# Patient Record
Sex: Female | Born: 1955 | Race: White | Hispanic: No | Marital: Married | Smoking: Never smoker
Health system: Southern US, Community
[De-identification: ages and names within clinical notes are randomized; demographics above are authoritative.]

## PROBLEM LIST (undated history)

## (undated) DIAGNOSIS — R51 Headache: Secondary | ICD-10-CM

## (undated) DIAGNOSIS — Z789 Other specified health status: Secondary | ICD-10-CM

## (undated) DIAGNOSIS — M810 Age-related osteoporosis without current pathological fracture: Secondary | ICD-10-CM

## (undated) DIAGNOSIS — E785 Hyperlipidemia, unspecified: Secondary | ICD-10-CM

## (undated) DIAGNOSIS — K219 Gastro-esophageal reflux disease without esophagitis: Secondary | ICD-10-CM

## (undated) HISTORY — PX: NO PAST SURGERIES: SHX2092

## (undated) HISTORY — DX: Hyperlipidemia, unspecified: E78.5

## (undated) HISTORY — DX: Gastro-esophageal reflux disease without esophagitis: K21.9

## (undated) HISTORY — DX: Age-related osteoporosis without current pathological fracture: M81.0

## (undated) HISTORY — PX: HERNIA REPAIR: SHX51

---

## 2004-04-18 ENCOUNTER — Other Ambulatory Visit: Admission: RE | Admit: 2004-04-18 | Discharge: 2004-04-18 | Payer: Self-pay | Admitting: Family Medicine

## 2006-02-17 ENCOUNTER — Other Ambulatory Visit: Admission: RE | Admit: 2006-02-17 | Discharge: 2006-02-17 | Payer: Self-pay | Admitting: Family Medicine

## 2006-02-24 ENCOUNTER — Ambulatory Visit: Payer: Self-pay | Admitting: General Practice

## 2007-03-01 ENCOUNTER — Ambulatory Visit: Payer: Self-pay | Admitting: General Practice

## 2008-03-06 ENCOUNTER — Ambulatory Visit: Payer: Self-pay | Admitting: General Practice

## 2009-03-12 ENCOUNTER — Ambulatory Visit: Payer: Self-pay | Admitting: General Practice

## 2009-04-02 ENCOUNTER — Ambulatory Visit: Payer: Self-pay | Admitting: General Practice

## 2010-03-05 ENCOUNTER — Ambulatory Visit: Payer: Self-pay | Admitting: General Practice

## 2011-03-04 ENCOUNTER — Ambulatory Visit: Payer: Self-pay

## 2012-03-01 ENCOUNTER — Ambulatory Visit: Payer: Self-pay

## 2012-07-12 ENCOUNTER — Other Ambulatory Visit: Payer: Self-pay | Admitting: Obstetrics

## 2012-07-14 ENCOUNTER — Encounter (HOSPITAL_COMMUNITY): Payer: Self-pay

## 2012-07-18 ENCOUNTER — Other Ambulatory Visit: Payer: Self-pay | Admitting: Obstetrics

## 2012-07-19 ENCOUNTER — Encounter (HOSPITAL_COMMUNITY): Payer: Self-pay

## 2012-07-19 ENCOUNTER — Encounter (HOSPITAL_COMMUNITY)
Admission: RE | Admit: 2012-07-19 | Discharge: 2012-07-19 | Disposition: A | Discharge status: Home / Self Care | Payer: PRIVATE HEALTH INSURANCE | Source: Ambulatory Visit | Attending: Obstetrics | Admitting: Obstetrics

## 2012-07-19 HISTORY — DX: Other specified health status: Z78.9

## 2012-07-19 HISTORY — DX: Headache: R51

## 2012-07-19 LAB — SURGICAL PCR SCREEN: Staphylococcus aureus: NEGATIVE

## 2012-07-19 NOTE — Pre-Procedure Instructions (Signed)
Per Hughes Better in Dr. Elpidio Eric office, it is ok to use recent labs done in PMD's office for surgery.

## 2012-07-19 NOTE — Patient Instructions (Signed)
Your procedure is scheduled on:07/22/12  Enter through the Main Entrance at :6am Pick up desk phone and dial [phone] and inform us of your arrival.  Please call [phone] if you have any problems the morning of surgery.  Remember: Do not eat or drink after midnight:Thursday   Take these meds the morning of surgery with a sip of water:none  DO NOT wear jewelry, eye make-up, lipstick,body lotion, or dark fingernail polish. Do not shave for 48 hours prior to surgery.  If you are to be admitted after surgery, leave suitcase in car until your room has been assigned. Patients discharged on the day of surgery will not be allowed to drive home.

## 2012-07-22 ENCOUNTER — Encounter (HOSPITAL_COMMUNITY): Payer: Self-pay | Admitting: *Deleted

## 2012-07-22 ENCOUNTER — Encounter (HOSPITAL_COMMUNITY): Payer: Self-pay

## 2012-07-22 ENCOUNTER — Ambulatory Visit (HOSPITAL_COMMUNITY)
Admission: RE | Admit: 2012-07-22 | Discharge: 2012-07-22 | Disposition: A | Discharge status: Home / Self Care | Payer: PRIVATE HEALTH INSURANCE | Source: Ambulatory Visit | Attending: Obstetrics | Admitting: Obstetrics

## 2012-07-22 ENCOUNTER — Ambulatory Visit (HOSPITAL_COMMUNITY): Payer: PRIVATE HEALTH INSURANCE

## 2012-07-22 ENCOUNTER — Encounter (HOSPITAL_COMMUNITY)
Admission: RE | Disposition: A | Discharge status: Home / Self Care | Payer: Self-pay | Source: Ambulatory Visit | Attending: Obstetrics

## 2012-07-22 DIAGNOSIS — N840 Polyp of corpus uteri: Secondary | ICD-10-CM | POA: Insufficient documentation

## 2012-07-22 DIAGNOSIS — N950 Postmenopausal bleeding: Secondary | ICD-10-CM | POA: Insufficient documentation

## 2012-07-22 DIAGNOSIS — R9389 Abnormal findings on diagnostic imaging of other specified body structures: Secondary | ICD-10-CM | POA: Insufficient documentation

## 2012-07-22 DIAGNOSIS — D279 Benign neoplasm of unspecified ovary: Secondary | ICD-10-CM | POA: Insufficient documentation

## 2012-07-22 HISTORY — PX: LAPAROSCOPY: SHX197

## 2012-07-22 HISTORY — PX: SALPINGOOPHORECTOMY: SHX82

## 2012-07-22 HISTORY — PX: HYSTEROSCOPY WITH D & C: SHX1775

## 2012-07-22 SURGERY — LAPAROSCOPY OPERATIVE
Anesthesia: General | Wound class: Clean Contaminated

## 2012-07-22 MED ORDER — PROPOFOL 10 MG/ML IV EMUL
INTRAVENOUS | Status: AC
  Filled 2012-07-22: qty 20

## 2012-07-22 MED ORDER — FENTANYL CITRATE 0.05 MG/ML IJ SOLN
INTRAMUSCULAR | Status: AC
  Administered 2012-07-22: 25 ug via INTRAVENOUS
  Filled 2012-07-22: qty 2

## 2012-07-22 MED ORDER — ONDANSETRON HCL 4 MG/2ML IJ SOLN
INTRAMUSCULAR | Status: AC
  Filled 2012-07-22: qty 2

## 2012-07-22 MED ORDER — FENTANYL CITRATE 0.05 MG/ML IJ SOLN: 25.0000 ug | INTRAMUSCULAR | Status: DC | PRN

## 2012-07-22 MED ORDER — FENTANYL CITRATE 0.05 MG/ML IJ SOLN
INTRAMUSCULAR | Status: DC | PRN
  Administered 2012-07-22: 100 ug via INTRAVENOUS

## 2012-07-22 MED ORDER — MEPERIDINE HCL 25 MG/ML IJ SOLN: 6.2500 mg | INTRAMUSCULAR | Status: DC | PRN

## 2012-07-22 MED ORDER — LIDOCAINE HCL (CARDIAC) 20 MG/ML IV SOLN
INTRAVENOUS | Status: DC | PRN
  Administered 2012-07-22: 50 mg via INTRAVENOUS

## 2012-07-22 MED ORDER — BUPIVACAINE HCL (PF) 0.25 % IJ SOLN
INTRAMUSCULAR | Status: AC
  Filled 2012-07-22: qty 30

## 2012-07-22 MED ORDER — VASOPRESSIN 20 UNIT/ML IJ SOLN
INTRAMUSCULAR | Status: AC
  Filled 2012-07-22: qty 1

## 2012-07-22 MED ORDER — DEXAMETHASONE SODIUM PHOSPHATE 10 MG/ML IJ SOLN
INTRAMUSCULAR | Status: DC | PRN
  Administered 2012-07-22: 10 mg via INTRAVENOUS

## 2012-07-22 MED ORDER — KETOROLAC TROMETHAMINE 30 MG/ML IJ SOLN
INTRAMUSCULAR | Status: DC | PRN
  Administered 2012-07-22: 30 mg via INTRAVENOUS

## 2012-07-22 MED ORDER — FENTANYL CITRATE 0.05 MG/ML IJ SOLN
INTRAMUSCULAR | Status: AC
  Filled 2012-07-22: qty 5

## 2012-07-22 MED ORDER — SILVER NITRATE-POT NITRATE 75-25 % EX MISC
CUTANEOUS | Status: AC
  Filled 2012-07-22: qty 1

## 2012-07-22 MED ORDER — CHLOROPROCAINE HCL 1 % IJ SOLN
INTRAMUSCULAR | Status: DC | PRN
  Administered 2012-07-22: 30 mL

## 2012-07-22 MED ORDER — BUPIVACAINE HCL (PF) 0.25 % IJ SOLN
INTRAMUSCULAR | Status: DC | PRN
  Administered 2012-07-22: 30 mL

## 2012-07-22 MED ORDER — KETOROLAC TROMETHAMINE 30 MG/ML IJ SOLN
INTRAMUSCULAR | Status: AC
  Filled 2012-07-22: qty 1

## 2012-07-22 MED ORDER — PROMETHAZINE HCL 25 MG/ML IJ SOLN: 6.2500 mg | INTRAMUSCULAR | Status: DC | PRN

## 2012-07-22 MED ORDER — MIDAZOLAM HCL 5 MG/5ML IJ SOLN
INTRAMUSCULAR | Status: DC | PRN
  Administered 2012-07-22: 2 mg via INTRAVENOUS

## 2012-07-22 MED ORDER — ONDANSETRON HCL 4 MG/2ML IJ SOLN
INTRAMUSCULAR | Status: DC | PRN
  Administered 2012-07-22: 4 mg via INTRAVENOUS

## 2012-07-22 MED ORDER — PROPOFOL 10 MG/ML IV EMUL
INTRAVENOUS | Status: DC | PRN
  Administered 2012-07-22 (×2): 20 mg via INTRAVENOUS
  Administered 2012-07-22: 160 mg via INTRAVENOUS

## 2012-07-22 MED ORDER — GLYCOPYRROLATE 0.2 MG/ML IJ SOLN
INTRAMUSCULAR | Status: AC
  Filled 2012-07-22: qty 1

## 2012-07-22 MED ORDER — ROCURONIUM BROMIDE 50 MG/5ML IV SOLN
INTRAVENOUS | Status: AC
  Filled 2012-07-22: qty 1

## 2012-07-22 MED ORDER — OXYCODONE-ACETAMINOPHEN 5-325 MG PO TABS: 2.0000 | ORAL_TABLET | ORAL | Status: DC | PRN

## 2012-07-22 MED ORDER — ROCURONIUM BROMIDE 100 MG/10ML IV SOLN
INTRAVENOUS | Status: DC | PRN
  Administered 2012-07-22: 10 mg via INTRAVENOUS
  Administered 2012-07-22: 30 mg via INTRAVENOUS

## 2012-07-22 MED ORDER — MIDAZOLAM HCL 2 MG/2ML IJ SOLN
INTRAMUSCULAR | Status: AC
  Filled 2012-07-22: qty 2

## 2012-07-22 MED ORDER — NEOSTIGMINE METHYLSULFATE 1 MG/ML IJ SOLN
INTRAMUSCULAR | Status: DC | PRN
  Administered 2012-07-22: 1 mg via INTRAVENOUS

## 2012-07-22 MED ORDER — IBUPROFEN 200 MG PO TABS: 600.0000 mg | ORAL_TABLET | Freq: Four times a day (QID) | ORAL | Status: DC | PRN

## 2012-07-22 MED ORDER — LIDOCAINE HCL (CARDIAC) 20 MG/ML IV SOLN
INTRAVENOUS | Status: AC
  Filled 2012-07-22: qty 5

## 2012-07-22 MED ORDER — GLUCAGON HCL (RDNA) 1 MG IJ SOLR
INTRAMUSCULAR | Status: AC
  Filled 2012-07-22: qty 1

## 2012-07-22 MED ORDER — PHENYLEPHRINE 40 MCG/ML (10ML) SYRINGE FOR IV PUSH (FOR BLOOD PRESSURE SUPPORT)
PREFILLED_SYRINGE | INTRAVENOUS | Status: AC
  Filled 2012-07-22: qty 5

## 2012-07-22 MED ORDER — GLYCOPYRROLATE 0.2 MG/ML IJ SOLN
INTRAMUSCULAR | Status: DC | PRN
  Administered 2012-07-22 (×2): 0.2 mg via INTRAVENOUS

## 2012-07-22 MED ORDER — GLYCINE 1.5 % IR SOLN
Status: DC | PRN
  Administered 2012-07-22: 3000 mL

## 2012-07-22 MED ORDER — LACTATED RINGERS IV SOLN
INTRAVENOUS | Status: DC
  Administered 2012-07-22 (×3): via INTRAVENOUS

## 2012-07-22 MED ORDER — KETOROLAC TROMETHAMINE 30 MG/ML IJ SOLN: 15.0000 mg | Freq: Once | INTRAMUSCULAR | Status: DC | PRN

## 2012-07-22 MED ORDER — MIDAZOLAM HCL 2 MG/2ML IJ SOLN: 0.5000 mg | Freq: Once | INTRAMUSCULAR | Status: DC | PRN

## 2012-07-22 MED ORDER — LACTATED RINGERS IR SOLN
Status: DC | PRN
  Administered 2012-07-22: 3000 mL

## 2012-07-22 MED ORDER — CHLOROPROCAINE HCL 1 % IJ SOLN
INTRAMUSCULAR | Status: AC
  Filled 2012-07-22: qty 30

## 2012-07-22 MED ORDER — PHENYLEPHRINE HCL 10 MG/ML IJ SOLN
INTRAMUSCULAR | Status: DC | PRN
  Administered 2012-07-22: 80 ug via INTRAVENOUS

## 2012-07-22 MED ORDER — HEPARIN SODIUM (PORCINE) 5000 UNIT/ML IJ SOLN
INTRAMUSCULAR | Status: DC | PRN
  Administered 2012-07-22: 5000 [IU] via SUBCUTANEOUS

## 2012-07-22 MED ORDER — DEXAMETHASONE SODIUM PHOSPHATE 10 MG/ML IJ SOLN
INTRAMUSCULAR | Status: AC
  Filled 2012-07-22: qty 1

## 2012-07-22 SURGICAL SUPPLY — 55 items
ADH SKN CLS APL DERMABOND .7 (GAUZE/BANDAGES/DRESSINGS)
BAG SPEC RTRVL LRG 6X4 10 (ENDOMECHANICALS) ×2
BARRIER ADHS 3X4 INTERCEED (GAUZE/BANDAGES/DRESSINGS) IMPLANT
BLADE SURG 15 STRL LF C SS BP (BLADE) ×2 IMPLANT
BLADE SURG 15 STRL SS (BLADE) ×3
BRR ADH 4X3 ABS CNTRL BYND (GAUZE/BANDAGES/DRESSINGS)
CABLE HIGH FREQUENCY MONO STRZ (ELECTRODE) IMPLANT
CANISTER SUCTION 2500CC (MISCELLANEOUS) ×3 IMPLANT
CATH ROBINSON RED A/P 16FR (CATHETERS) ×3 IMPLANT
CHLORAPREP W/TINT 26ML (MISCELLANEOUS) ×3 IMPLANT
CLOTH BEACON ORANGE TIMEOUT ST (SAFETY) ×3 IMPLANT
CONTAINER PREFILL 10% NBF 60ML (FORM) ×6 IMPLANT
COVER MAYO STAND STRL (DRAPES) IMPLANT
DERMABOND ADVANCED (GAUZE/BANDAGES/DRESSINGS)
DERMABOND ADVANCED .7 DNX12 (GAUZE/BANDAGES/DRESSINGS) IMPLANT
DRESSING TELFA 8X3 (GAUZE/BANDAGES/DRESSINGS) ×3 IMPLANT
ELECT NDL TIP 2.8 STRL (NEEDLE) ×2 IMPLANT
ELECT NEEDLE TIP 2.8 STRL (NEEDLE) ×3 IMPLANT
ELECT REM PT RETURN 9FT ADLT (ELECTROSURGICAL)
ELECTRODE REM PT RTRN 9FT ADLT (ELECTROSURGICAL) IMPLANT
FORCEPS CUTTING 33CM 5MM (CUTTING FORCEPS) IMPLANT
GLOVE BIO SURGEON STRL SZ 6.5 (GLOVE) ×4 IMPLANT
GLOVE BIOGEL PI IND STRL 7.0 (GLOVE) ×2 IMPLANT
GLOVE BIOGEL PI INDICATOR 7.0 (GLOVE) ×1
GOWN PREVENTION PLUS LG XLONG (DISPOSABLE) ×9 IMPLANT
GOWN STRL REIN XL XLG (GOWN DISPOSABLE) ×6 IMPLANT
IV STOPCOCK 4 WAY 40  W/Y SET (IV SOLUTION)
IV STOPCOCK 4 WAY 40 W/Y SET (IV SOLUTION) IMPLANT
LOOP ANGLED CUTTING 22FR (CUTTING LOOP) IMPLANT
MANIPULATOR UTERINE 4.5 ZUMI (MISCELLANEOUS) ×1 IMPLANT
NEEDLE INSUFFLATION 120MM (ENDOMECHANICALS) IMPLANT
PACK HYSTEROSCOPY LF (CUSTOM PROCEDURE TRAY) ×3 IMPLANT
PACK LAPAROSCOPY BASIN (CUSTOM PROCEDURE TRAY) ×3 IMPLANT
PAD OB MATERNITY 4.3X12.25 (PERSONAL CARE ITEMS) ×3 IMPLANT
PENCIL BUTTON HOLSTER BLD 10FT (ELECTRODE) ×3 IMPLANT
POUCH SPECIMEN RETRIEVAL 10MM (ENDOMECHANICALS) ×1 IMPLANT
PROTECTOR NERVE ULNAR (MISCELLANEOUS) ×3 IMPLANT
SCISSORS LAP 5X35 DISP (ENDOMECHANICALS) IMPLANT
SEALER TISSUE G2 CVD JAW 35 (ENDOMECHANICALS) IMPLANT
SEALER TISSUE G2 CVD JAW 45CM (ENDOMECHANICALS)
SET IRRIG TUBING LAPAROSCOPIC (IRRIGATION / IRRIGATOR) IMPLANT
SOLUTION ELECTROLUBE (MISCELLANEOUS) IMPLANT
STENT BALLN UTERINE 3CM 6FR (Stent) IMPLANT
STENT BALLN UTERINE 4CM 6FR (STENTS) IMPLANT
SUT VICRYL 0 UR6 27IN ABS (SUTURE) IMPLANT
SUT VICRYL 4-0 PS2 18IN ABS (SUTURE) IMPLANT
SYR 50ML LL SCALE MARK (SYRINGE) IMPLANT
SYR 5ML LL (SYRINGE) ×3 IMPLANT
TOWEL OR 17X24 6PK STRL BLUE (TOWEL DISPOSABLE) ×6 IMPLANT
TRAY FOLEY CATH 14FR (SET/KITS/TRAYS/PACK) ×3 IMPLANT
TROCAR BALLN 12MMX100 BLUNT (TROCAR) ×1 IMPLANT
TROCAR XCEL NON-BLD 11X100MML (ENDOMECHANICALS) IMPLANT
TROCAR XCEL NON-BLD 5MMX100MML (ENDOMECHANICALS) ×6 IMPLANT
WARMER LAPAROSCOPE (MISCELLANEOUS) ×3 IMPLANT
WATER STERILE IRR 1000ML POUR (IV SOLUTION) ×3 IMPLANT

## 2012-07-22 NOTE — H&P (Signed)
See scanned doc for full H&P  Medically uncomplicated 57 yo pt w/ irreg postmenopausal bleeding. Work-up revealed nl pap, nl embx and probable endometrial polyp. Also 6.5 cm ovarian cyst w/o flow, most consistent with dermoid ovarian cyst.   Plan for hysteroscopy, D&C, polypectomy, laprascopic BSO. Pt aware R/B of procedure.  No change to meds, allergies, history  Plan as scheduled.  FOGLEMAN,KELLY A. 07/22/2012 7:40 AM

## 2012-07-22 NOTE — Transfer of Care (Signed)
Immediate Anesthesia Transfer of Care Note  Patient: Monica Wyatt  Procedure(s) Performed: Procedure(s): LAPAROSCOPY OPERATIVE (N/A) SALPINGO OOPHORECTOMY (Bilateral) DILATATION AND CURETTAGE /HYSTEROSCOPY (N/A)  Patient Location: PACU  Anesthesia Type:General  Level of Consciousness: awake  Airway & Oxygen Therapy: Patient Spontanous Breathing  Post-op Assessment: Report given to PACU RN  Post vital signs: stable  Filed Vitals:   07/22/12 0602  BP: 113/76  Pulse: 72  Temp: 36.3 C  Resp: 18    Complications: No apparent anesthesia complications

## 2012-07-22 NOTE — Brief Op Note (Signed)
07/22/2012  9:48 AM  PATIENT:  Monica Wyatt  57 y.o. female  PRE-OPERATIVE DIAGNOSIS:  Left Ovarian Dermoid Cyst;  Endometrial Thickening, endometrial polyp  POST-OPERATIVE DIAGNOSIS:  Left Ovarian Dermoid Cyst;  Endometrial Thickening, endometrial thickening  PROCEDURE:  Procedure(s): LAPAROSCOPY OPERATIVE (N/A) SALPINGO OOPHORECTOMY (Bilateral) DILATATION AND CURETTAGE /HYSTEROSCOPY (N/A) operative hysteroscopy polypectomy  SURGEON:  Surgeon(s) and Role:    * Kelly A. Ernestina Penna, MD - Primary    * Genia Del, MD - Assisting  PHYSICIAN ASSISTANT:   ASSISTANTSSeymour Bars, MD   ANESTHESIA:   general  EBL:  Total I/O In: 1700 [I.V.:1700] Out: 310 [Urine:300; Blood:10]  BLOOD ADMINISTERED:none  DRAINS: Urinary Catheter (Foley)   LOCAL MEDICATIONS USED:  MARCAINE,   SPECIMEN:  Source of Specimen:  Bilateral tubes and ovaries, endometrial curettings, endometrial polyp  DISPOSITION OF SPECIMEN:  PATHOLOGY  COUNTS:  YES  TOURNIQUET:  * No tourniquets in log *  DICTATION: .Note written in EPIC  PLAN OF CARE: Discharge to home after PACU  PATIENT DISPOSITION:  PACU - hemodynamically stable.   Delay start of Pharmacological VTE agent (>24hrs) due to surgical blood loss or risk of bleeding: yes

## 2012-07-22 NOTE — Op Note (Signed)
07/22/2012  9:48 AM  PATIENT:  Monica Wyatt  57 y.o. female  PRE-OPERATIVE DIAGNOSIS:  Left Ovarian Dermoid Cyst;  Endometrial Thickening, endometrial polyp  POST-OPERATIVE DIAGNOSIS:  Left Ovarian Dermoid Cyst;  Endometrial Thickening, endometrial thickening  PROCEDURE:  Procedure(s): LAPAROSCOPY OPERATIVE (N/A) SALPINGO OOPHORECTOMY (Bilateral) DILATATION AND CURETTAGE /HYSTEROSCOPY (N/A) operative hysteroscopy polypectomy  SURGEON:  Surgeon(s) and Role:    * Kelly A. Ernestina Penna, MD - Primary    * Genia Del, MD - Assisting  PHYSICIAN ASSISTANT:   ASSISTANTSSeymour Bars, MD   ANESTHESIA:   general  EBL:  Total I/O In: 1700 [I.V.:1700] Out: 310 [Urine:300; Blood:10]  BLOOD ADMINISTERED:none  DRAINS: Urinary Catheter (Foley)   LOCAL MEDICATIONS USED:  MARCAINE,   SPECIMEN:  Source of Specimen:  Bilateral tubes and ovaries, endometrial curettings, endometrial polyp  DISPOSITION OF SPECIMEN:  PATHOLOGY  COUNTS:  YES  TOURNIQUET:  * No tourniquets in log *  DICTATION: .Note written in EPIC  PLAN OF CARE: Discharge to home after PACU  PATIENT DISPOSITION:  PACU - hemodynamically stable.   Delay start of Pharmacological VTE agent (>24hrs) due to surgical blood loss or risk of bleeding: yes  Estimated blood loss: 20 cc Complications: None Antibiotics: None  Findings: 6 cm size left ovarian dermoid, normal right ovary, normal bilateral tubes, small atrophic uterus with 1 cm posterior wall endometrial polyp. Normal liver edge, normal appendix  Indications: This is a 57 year old postmenopausal woman who presented to the office with postmenopausal bleeding. Ultrasound revealed focal thickening and a followup sonohysterogram reveals endometrial polyp. Incidental finding of 6 cm ovarian dermoid. Patient was managed expectantly for 3 months with no further bleeding however began to feel increased discomfort in the left lower quadrant. Decision was made to proceed with  hysteroscopy D&C, polypectomy, laparoscopic bilateral salpingo-oophorectomy. Patient understood hormonal risks and surgical risks but preferred to remove the contralateral ovary for concern of maybe needing further surgery in the future.   Procedure:  Patient was prepped and draped in the normal sterile fashion in dorsal supine lithotomy position. A Foley catheter was sterilely inserted into the bladder A bimanual examination was done to assess the size and position of the uterus. A sterile speculum was placed in the vagina and a single-tooth tenaculum used to grasp the anterior lip of the cervix. An acorn uterine manipulator was placed in the cervix and was attached to the tenaculum . The speculum was then removed.  Gloves were changed attention was turned to the patient's abdomen. 10 cc of one quarter percent Marcaine were used in the infraumbilical fold and in the left and right lower quadrants. A 10 mm skin incision was made in the supra-umbilical fold and blunt dissection was done until the fascia was identified. Coker clamps were placed on the fascial edges and the fascia was sharply divided with the Mayo scissors. The intraperitoneal cavity was entered. A pursestring suture of 0 Vicryl was placed along the fascial edges and the Hassan trocar was inserted into the abdomen. The peritoneum was then insufflated with CO2 gas to 15 mm of mercury.  The pelvis was fully evaluated with findings as above. Right and left lower quadrant sites were marked, local anesthetic was infused, and 5 mm skin incisions were made in the right left lower cord. Non-bladed 5 mm trochars were then inserted under direct visualization.  Grasping instruments were placed through the lower ports to aid in visualization of the patient's abdomen and pelvis. Bilateral ureters were seen peristalsing in the pelvic sidewalls.  He began on the left side. The left infundibulopelvic ligament was serially cauterized and transected with the gyrus  device. Dissection was carried out along the mesosalpinx until the uterine ovarian ligament was serially cauterized and transected. The left tube and ovary were completely transected and placed in the pelvis. Good hemostasis was assured. The uterus was then retracted to the left side there right tube and ovary were evaluated. The right ureter was seen again. The right infundibular pelvic ligament was serially cauterized and transected. Dissection was carried along the mesosalpinx until the right uterine ovarian ligament was serially transected and cauterized. The right tube and ovary were then free. Hemostasis was noted of the cauterized areas. A 5 mm camera was placed through the left lower quadrant port a Endo Catch bag was placed in the umbilicus port and grasping instruments were placed through the right lower quadrant port. Both tubes and ovaries were placed into the Endo Catch bag which was then pulled up to the abdomen. The umbilical trocar was removed the bag was opened the cyst was drained and the Endo Catch bag with the bilateral tubes and ovaries was then able to be removed through the umbilical port. This port was then placed back in the abdomen pneumoperitoneum was recreated and visualization of all pedicles noted good hemostasis. Suction irrigation was carried out.  Attention was then turned to the pelvis. Tenaculum was replaced on the cervix after the acorn manipulator was removed and the speculum was inserted. Using the small hysteroscope evaluation of the uterine cavity was noted. Bilateral ostia were seen. A posterior wall 1 cm polyp was noted. The hysteroscope was removed and attempt was made with grasping forcep and sharp curettage to remove the polyp. This was unsuccessful. The hysteroscope was reinserted several times to ensure adequate placement of the polyp forceps and the curette. After about 10 minutes of unsuccessfully removing the polyp the hysteroscopic graspers and scissors were used  to serially cut and tied on the polyp. Eventually the polyp came free and was removed. A second curettage was then done to clear all uterine walls. Hysteroscope was used after the last curettage to confirm good hemostasis of the uterus.  All  was removed from the vagina. Gown and gloves were then changed and attention was then made to the abdomen. No. Peritoneum was recreated and last look oh laparoscopy was done all remaining fluid was removed from the pelvis all pedicles were found to be hemostatic. 20 cc of quarter percent Marcaine were then infused into the pelvis and toward all IV was given. All instruments removed under direct visualization   the pursestring suture at the umbilicus was tied and the umbilical incision was closed with 4-0 Vicryl in a subcuticular fashion. Single sutures were placed the left and right lower quadrants and then covered with Dermabond    Sponge lap and needle counts were correct x3. Patient was woken from general anesthesia having tolerated the procedure well.

## 2012-07-22 NOTE — Anesthesia Postprocedure Evaluation (Signed)
  Anesthesia Post-op Note  Patient: Monica Wyatt  Procedure(s) Performed: Procedure(s): LAPAROSCOPY OPERATIVE (N/A) SALPINGO OOPHORECTOMY (Bilateral) DILATATION AND CURETTAGE /HYSTEROSCOPY (N/A)  Patient is awake and responsive. Pain and nausea are reasonably well controlled. Vital signs are stable and clinically acceptable. Oxygen saturation is clinically acceptable. There are no apparent anesthetic complications at this time. Patient is ready for discharge.

## 2012-07-22 NOTE — Anesthesia Preprocedure Evaluation (Addendum)
Anesthesia Evaluation  Patient identified by MRN, date of birth, ID band Patient awake    Reviewed: Allergy & Precautions, H&P , Patient's Chart, lab work & pertinent test results, reviewed documented beta blocker date and time   History of Anesthesia Complications Negative for: history of anesthetic complications  Airway Mallampati: II TM Distance: >3 FB Neck ROM: full    Dental no notable dental hx.    Pulmonary neg pulmonary ROS,  breath sounds clear to auscultation  Pulmonary exam normal       Cardiovascular Exercise Tolerance: Good negative cardio ROS  Rhythm:regular Rate:Normal     Neuro/Psych  Headaches, negative neurological ROS  negative psych ROS   GI/Hepatic negative GI ROS, Neg liver ROS,   Endo/Other  negative endocrine ROS  Renal/GU negative Renal ROS     Musculoskeletal   Abdominal   Peds  Hematology negative hematology ROS (+)   Anesthesia Other Findings   Reproductive/Obstetrics negative OB ROS                           Anesthesia Physical Anesthesia Plan  ASA: II  Anesthesia Plan: General ETT   Post-op Pain Management:    Induction:   Airway Management Planned:   Additional Equipment:   Intra-op Plan:   Post-operative Plan:   Informed Consent: I have reviewed the patients History and Physical, chart, labs and discussed the procedure including the risks, benefits and alternatives for the proposed anesthesia with the patient or authorized representative who has indicated his/her understanding and acceptance.   Dental Advisory Given  Plan Discussed with: CRNA, Surgeon and Anesthesiologist  Anesthesia Plan Comments:        Anesthesia Quick Evaluation

## 2012-07-25 ENCOUNTER — Encounter (HOSPITAL_COMMUNITY): Payer: Self-pay | Admitting: Obstetrics

## 2013-03-08 ENCOUNTER — Ambulatory Visit: Payer: Self-pay | Admitting: General Practice

## 2013-03-23 ENCOUNTER — Ambulatory Visit: Payer: Self-pay | Admitting: General Practice

## 2014-03-06 ENCOUNTER — Ambulatory Visit: Payer: Self-pay | Admitting: General Practice

## 2014-10-24 IMAGING — MG MM DIGITAL SCREENING BILAT W/ CAD
1 series · 4 of 4 positions shown · non-contrast
Comparison: Previous Exam(s)

CLINICAL DATA: Screening.

EXAM:
DIGITAL SCREENING BILATERAL MAMMOGRAM WITH CAD

[R CC · right · 4 of 4 slices shown]
[im 1/4]
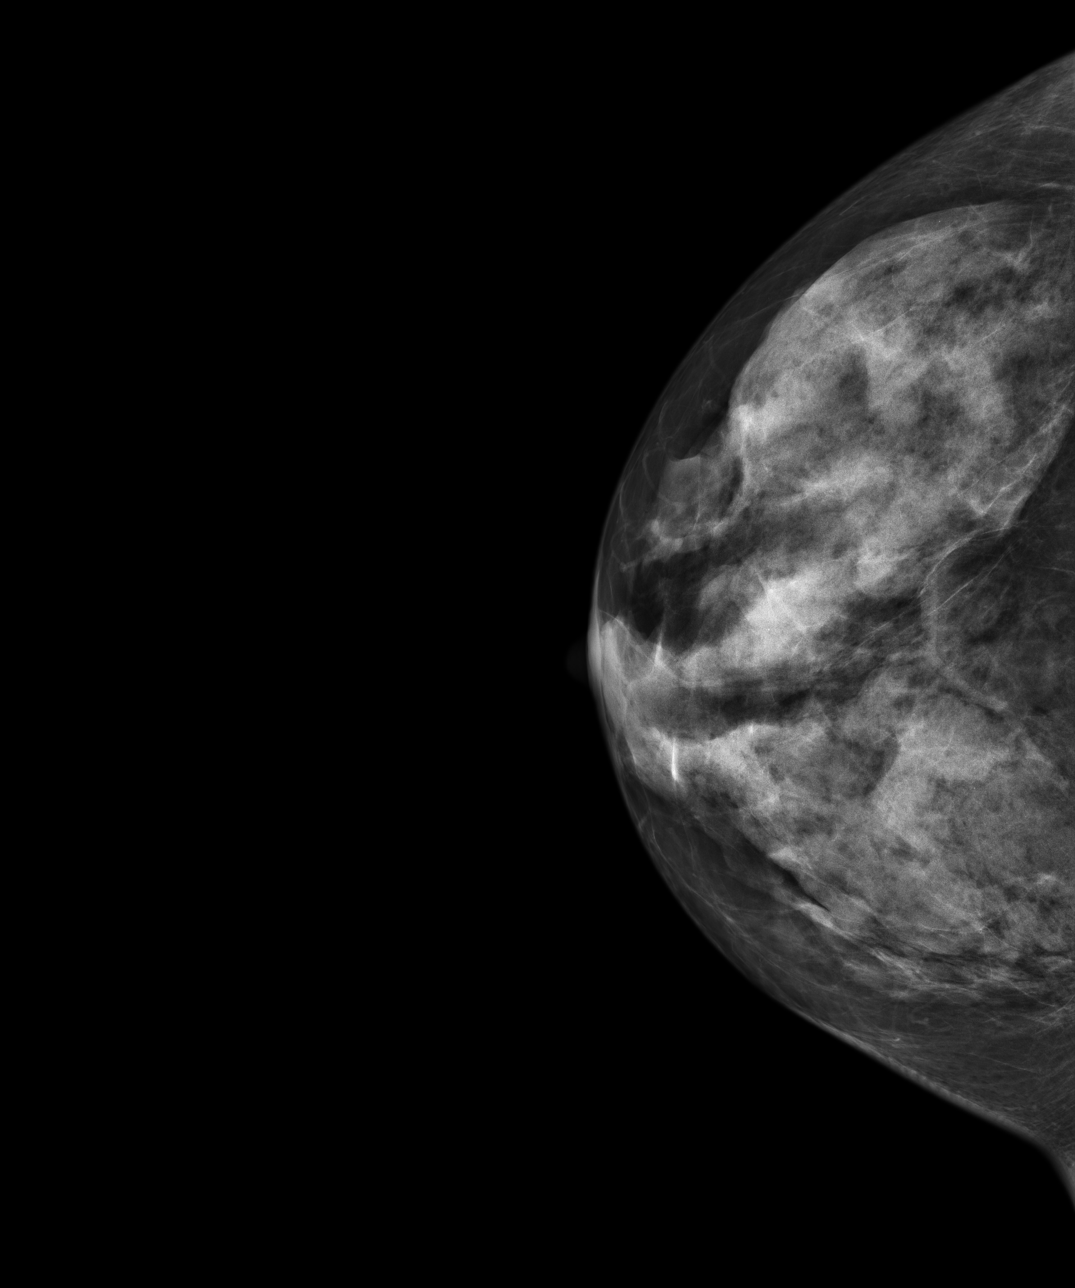
[im 2/4]
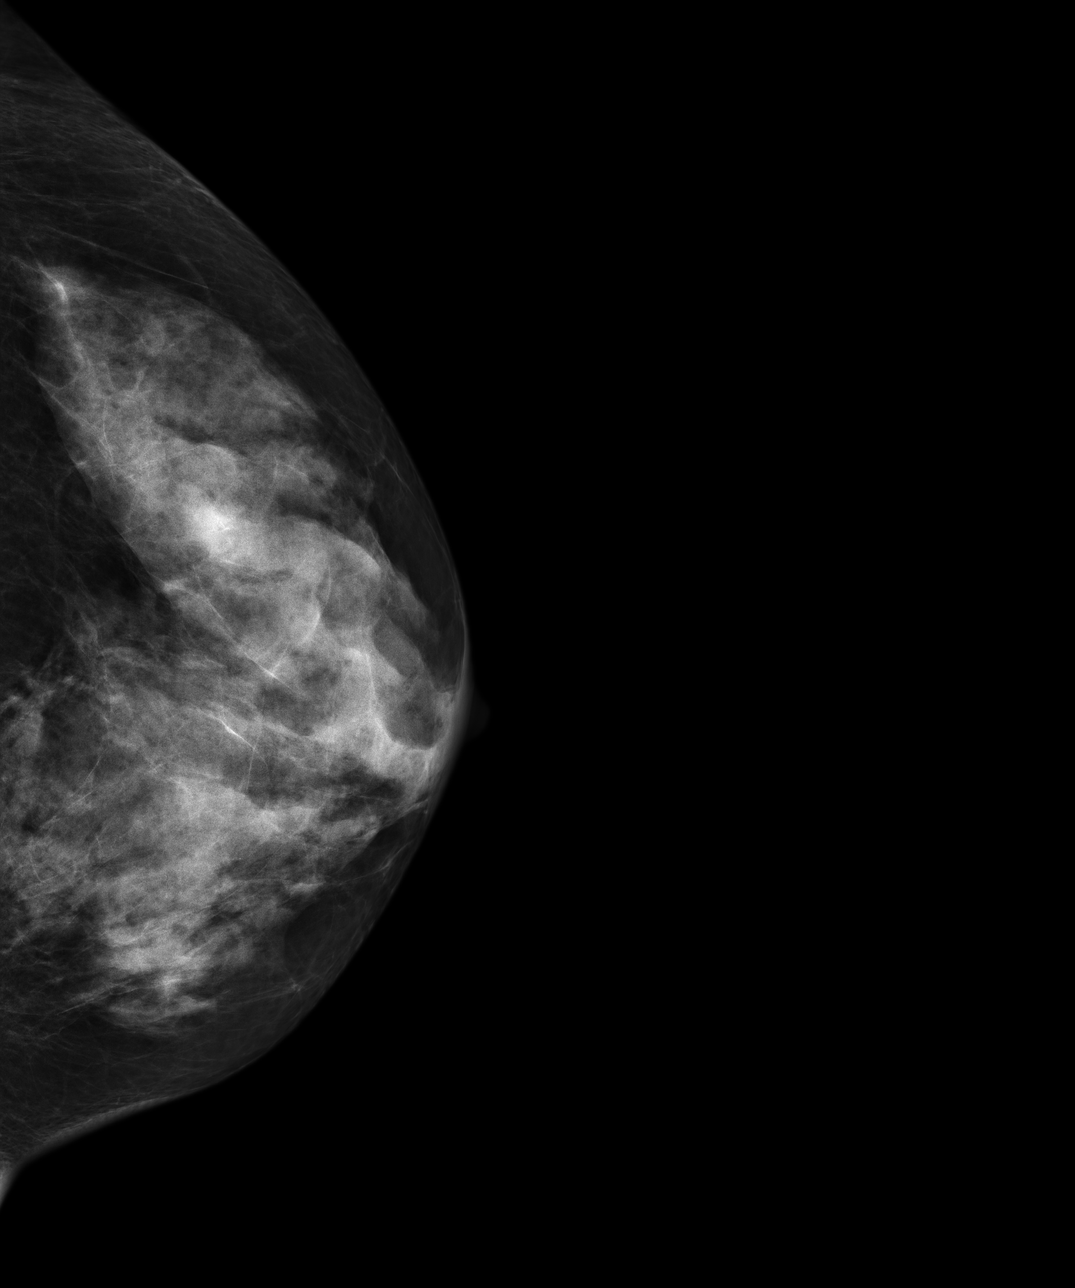
[im 3/4]
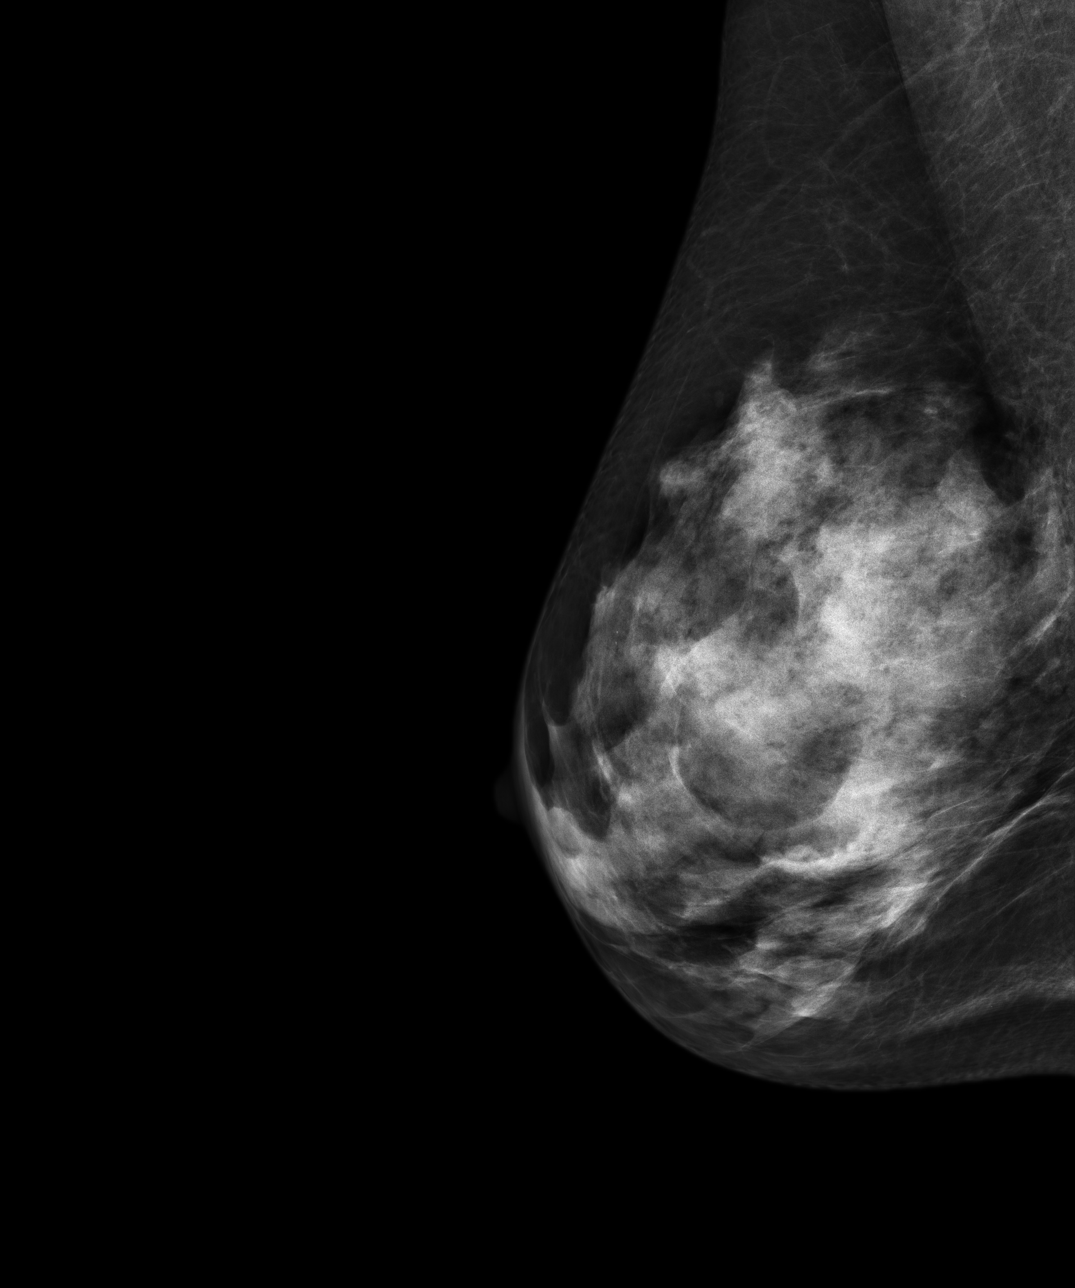
[im 4/4]
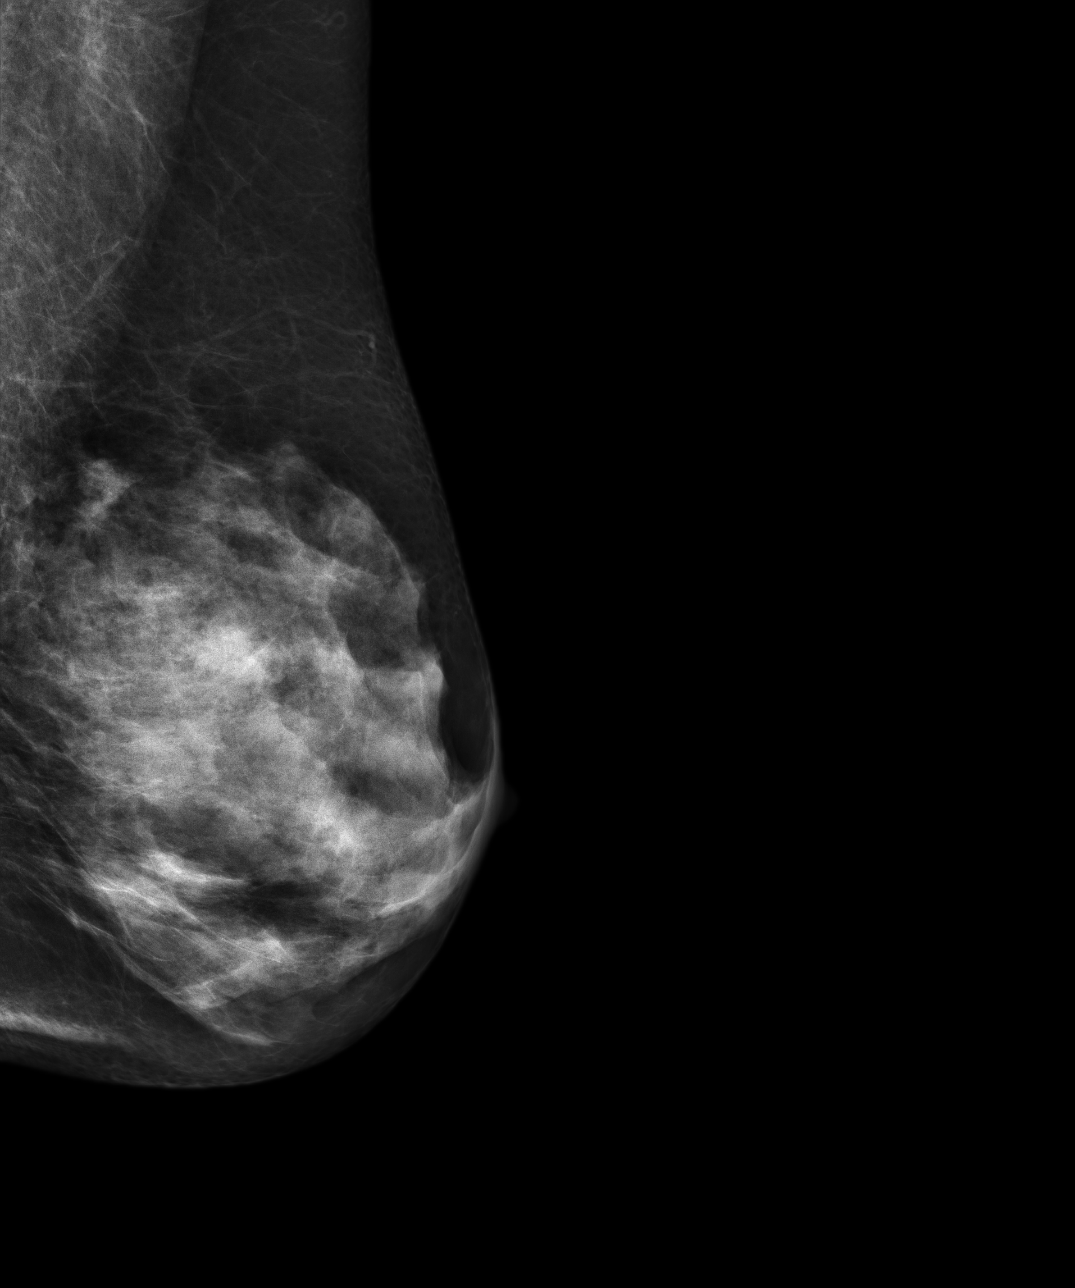

[4 of 4 positions shown; findings below may reference images not displayed]

ACR Breast Density Category d: The breasts are extremely dense,
which lowers the sensitivity of mammography.
FINDINGS: In the left breast possible mass warrants further imaging evaluation
with spot compression views and possibly ultrasound. In the right
breast, possible mass warrants further imaging evaluation with spot
compression views and possibly ultrasound. Images were processed
with CAD.
IMPRESSION: Further imaging evaluation is suggested for possible Cun the left
breast.

Further imaging evaluation is suggested for possible Cun the
right breast.

RECOMMENDATION:
Diagnostic mammogram and possibly ultrasound of both breasts.
(Code:R2-A-22X)

The patient will be contacted regarding the findings, and additional
imaging will be scheduled.

BI-RADS CATEGORY  0: Incomplete. Need additional imaging evaluation
and/or prior mammograms for comparison.

## 2015-02-20 ENCOUNTER — Other Ambulatory Visit: Payer: Self-pay | Admitting: Obstetrics

## 2015-02-20 DIAGNOSIS — Z1231 Encounter for screening mammogram for malignant neoplasm of breast: Secondary | ICD-10-CM

## 2015-02-26 ENCOUNTER — Ambulatory Visit
Admission: RE | Admit: 2015-02-26 | Discharge: 2015-02-26 | Disposition: A | Discharge status: Home / Self Care | Payer: PRIVATE HEALTH INSURANCE | Source: Ambulatory Visit | Attending: Obstetrics | Admitting: Obstetrics

## 2015-02-26 DIAGNOSIS — Z1231 Encounter for screening mammogram for malignant neoplasm of breast: Secondary | ICD-10-CM | POA: Diagnosis not present

## 2015-10-16 ENCOUNTER — Other Ambulatory Visit: Payer: Self-pay | Admitting: Obstetrics

## 2015-10-16 DIAGNOSIS — Z780 Asymptomatic menopausal state: Secondary | ICD-10-CM

## 2015-11-20 ENCOUNTER — Ambulatory Visit
Admission: RE | Admit: 2015-11-20 | Discharge: 2015-11-20 | Disposition: A | Discharge status: Home / Self Care | Payer: PRIVATE HEALTH INSURANCE | Source: Ambulatory Visit | Attending: Obstetrics | Admitting: Obstetrics

## 2015-11-20 DIAGNOSIS — Z780 Asymptomatic menopausal state: Secondary | ICD-10-CM | POA: Insufficient documentation

## 2015-11-20 DIAGNOSIS — M810 Age-related osteoporosis without current pathological fracture: Secondary | ICD-10-CM | POA: Insufficient documentation

## 2016-02-17 ENCOUNTER — Other Ambulatory Visit: Payer: Self-pay | Admitting: Family Medicine

## 2016-02-18 LAB — CMP12+LP+TP+TSH+6AC+CBC/D/PLT
A/G RATIO: 1.8 (ref 1.2–2.2)
ALBUMIN: 4.6 g/dL (ref 3.5–5.5)
ALT: 16 IU/L (ref 0–32)
AST: 21 IU/L (ref 0–40)
Alkaline Phosphatase: 70 IU/L (ref 39–117)
BUN/Creatinine Ratio: 16 (ref 9–23)
BUN: 15 mg/dL (ref 6–24)
Basophils Absolute: 0 10*3/uL (ref 0.0–0.2)
Basos: 1 %
Bilirubin Total: 0.5 mg/dL (ref 0.0–1.2)
CALCIUM: 9.4 mg/dL (ref 8.7–10.2)
CHOLESTEROL TOTAL: 149 mg/dL (ref 100–199)
Chloride: 100 mmol/L (ref 96–106)
Chol/HDL Ratio: 2.8 ratio units (ref 0.0–4.4)
Creatinine, Ser: 0.93 mg/dL (ref 0.57–1.00)
EOS (ABSOLUTE): 0.1 10*3/uL (ref 0.0–0.4)
Eos: 2 %
Estimated CHD Risk: <0.5 times avg. (ref 0.0–1.0)
FREE THYROXINE INDEX: 1.9 (ref 1.2–4.9)
GFR calc Af Amer: 78 mL/min/{1.73_m2} (ref >59)
GFR calc non Af Amer: 67 mL/min/{1.73_m2} (ref >59)
GGT: 11 IU/L (ref 0–60)
GLOBULIN, TOTAL: 2.6 g/dL (ref 1.5–4.5)
Glucose: 79 mg/dL (ref 65–99)
HDL: 53 mg/dL (ref >39)
Hematocrit: 40.3 % (ref 34.0–46.6)
Hemoglobin: 13.2 g/dL (ref 11.1–15.9)
IMMATURE GRANS (ABS): 0 10*3/uL (ref 0.0–0.1)
Immature Granulocytes: 0 %
Iron: 117 ug/dL (ref 27–159)
LDH: 222 IU/L (ref 119–226)
LDL Calculated: 82 mg/dL (ref 0–99)
LYMPHS: 38 %
Lymphocytes Absolute: 1.5 10*3/uL (ref 0.7–3.1)
MCH: 31.1 pg (ref 26.6–33.0)
MCHC: 32.8 g/dL (ref 31.5–35.7)
MCV: 95 fL (ref 79–97)
MONOS ABS: 0.4 10*3/uL (ref 0.1–0.9)
Monocytes: 11 %
NEUTROS ABS: 1.9 10*3/uL (ref 1.4–7.0)
NEUTROS PCT: 48 %
PHOSPHORUS: 3.5 mg/dL (ref 2.5–4.5)
PLATELETS: 223 10*3/uL (ref 150–379)
POTASSIUM: 5 mmol/L (ref 3.5–5.2)
RBC: 4.25 x10E6/uL (ref 3.77–5.28)
RDW: 13.5 % (ref 12.3–15.4)
Sodium: 142 mmol/L (ref 134–144)
T3 UPTAKE RATIO: 28 % (ref 24–39)
T4 TOTAL: 6.8 ug/dL (ref 4.5–12.0)
TRIGLYCERIDES: 71 mg/dL (ref 0–149)
TSH: 3.18 u[IU]/mL (ref 0.450–4.500)
Total Protein: 7.2 g/dL (ref 6.0–8.5)
Uric Acid: 4.8 mg/dL (ref 2.5–7.1)
VLDL Cholesterol Cal: 14 mg/dL (ref 5–40)
WBC: 4 10*3/uL (ref 3.4–10.8)

## 2016-02-18 LAB — VITAMIN D 25 HYDROXY (VIT D DEFICIENCY, FRACTURES): Vit D, 25-Hydroxy: 42.8 ng/mL (ref 30.0–100.0)

## 2016-02-18 LAB — HGB A1C W/O EAG: Hgb A1c MFr Bld: 5.7 % — ABNORMAL HIGH (ref 4.8–5.6)

## 2016-02-27 LAB — BASIC METABOLIC PANEL: Glucose: 79 mg/dL

## 2016-02-27 LAB — HEMOGLOBIN A1C: HEMOGLOBIN A1C: 5.7

## 2016-02-27 LAB — LIPID PANEL
Cholesterol: 149 mg/dL (ref 0–200)
HDL: 53 mg/dL (ref 35–70)
LDL CALC: 82 mg/dL
TRIGLYCERIDES: 71 mg/dL (ref 40–160)

## 2016-04-20 ENCOUNTER — Ambulatory Visit: Payer: PRIVATE HEALTH INSURANCE | Admitting: Family Medicine

## 2016-06-08 ENCOUNTER — Ambulatory Visit (INDEPENDENT_AMBULATORY_CARE_PROVIDER_SITE_OTHER): Payer: PRIVATE HEALTH INSURANCE | Admitting: Adult Health

## 2016-06-08 ENCOUNTER — Encounter: Payer: Self-pay | Admitting: Adult Health

## 2016-06-08 VITALS — BP 108/76 | HR 80 | Ht 64.0 in | Wt 127.4 lb

## 2016-06-08 DIAGNOSIS — G44229 Chronic tension-type headache, not intractable: Secondary | ICD-10-CM

## 2016-06-08 DIAGNOSIS — K4090 Unilateral inguinal hernia, without obstruction or gangrene, not specified as recurrent: Secondary | ICD-10-CM

## 2016-06-08 DIAGNOSIS — E7800 Pure hypercholesterolemia, unspecified: Secondary | ICD-10-CM | POA: Diagnosis not present

## 2016-06-08 DIAGNOSIS — E785 Hyperlipidemia, unspecified: Secondary | ICD-10-CM | POA: Insufficient documentation

## 2016-06-08 MED ORDER — ATORVASTATIN CALCIUM 20 MG PO TABS: 20.0000 mg | ORAL_TABLET | Freq: Every day | ORAL | 1 refills | Status: DC

## 2016-06-08 NOTE — Progress Notes (Signed)
Subjective:    Patient ID: Monica Wyatt, female    DOB: 1956-05-04, 61 y.o.   MRN: [id number]  HPI:  Ms.Tarkington presents to establish as a new pt.  She feel well generally, with only acute issues of "tension HA", and left lower abdominal pain with "a bulge".  She has had HAs  On/off for years and feels that they are triggered by work stress.  She denies dizziness/vision changes or N/V during HA.  She also reports Oct 2017 she may have "lifted something heavy at work" and she developed LLQ "puffiness, like a bulge".  She reports that the bulge is intermittent, can be "pushed back in", and only caused minor aching at times.  She denies previous hernia's.  She denies diffuse abdominal pain or blood in stool.     Patient Care Team    Relationship Specialty Notifications Start End  Odella Aquas, NP PCP - General Family Medicine  06/08/16   [name], MD Consulting Physician Dermatology  06/08/16   Aloha Gell, MD Consulting Physician Obstetrics and Gynecology  06/08/16     Patient Active Problem List   Diagnosis Date Noted  . Elevated cholesterol 06/08/2016  . Left inguinal hernia 06/08/2016  . Chronic tension-type headache, not intractable 06/08/2016     Past Medical History:  Diagnosis Date  . Headache(784.0)   . Hyperlipidemia   . Medical history non-contributory   . Osteoporosis      Past Surgical History:  Procedure Laterality Date  . HYSTEROSCOPY W/D&C N/A 07/22/2012   Procedure: DILATATION AND CURETTAGE /HYSTEROSCOPY;  Surgeon: [name]. [name], MD;  Location: [facility];  Service: Gynecology;  Laterality: N/A;  . LAPAROSCOPY N/A 07/22/2012   Procedure: LAPAROSCOPY OPERATIVE;  Surgeon: [name] [name], MD;  Location: [facility];  Service: Gynecology;  Laterality: N/A;  . NO PAST SURGERIES    . SALPINGOOPHORECTOMY Bilateral 07/22/2012   Procedure: SALPINGO OOPHORECTOMY;  Surgeon: [name]. [name], MD;  Location: [facility];  Service: Gynecology;  Laterality: Bilateral;      Family History  Problem Relation Age of Onset  . Dementia Mother   . COPD Father   . Healthy Sister   . Alcohol abuse Brother   . Sleep apnea Son   . Healthy Sister      History  Drug Use No     History  Alcohol Use No     History  Smoking Status  . Never Smoker  Smokeless Tobacco  . Never Used     Outpatient Encounter Prescriptions as of 06/08/2016  Medication Sig  . alendronate (FOSAMAX) 70 MG tablet Take 1 tablet by mouth once a week.  [name] atorvastatin (LIPITOR) 20 MG tablet Take 1 tablet (20 mg total) by mouth daily.  [name] ibuprofen (ADVIL) 200 MG tablet Take 3 tablets (600 mg total) by mouth every 6 (six) hours as needed for pain.  . [DISCONTINUED] atorvastatin (LIPITOR) 20 MG tablet Take 1 tablet by mouth daily.  . [DISCONTINUED] Acetaminophen (TYLENOL PO) Take 2 tablets by mouth daily as needed (headache).  . [DISCONTINUED] estradiol (MINIVELLE) 0.075 MG/24HR Place 1 patch onto the skin 2 (two) times a week.  . [DISCONTINUED] oxyCODONE-acetaminophen (ROXICET) 5-325 MG per tablet Take 2 tablets by mouth every 4 (four) hours as needed for pain.  . [DISCONTINUED] progesterone (PROMETRIUM) 100 MG capsule Take 100 mg by mouth at bedtime as needed and may repeat dose one time if needed.   No facility-administered encounter medications on file as of 06/08/2016.  Allergies: Penicillins  Body mass index is 21.87 kg/m.  Blood pressure 108/76, pulse 80, height 5\' 4"  (1.626 m), weight 127 lb 6.4 oz (57.8 kg).      FOLLOW-UP:  Return in about 1 year (around 06/08/2017) for Regular Follow Up.   Review of Systems  Constitutional: Negative for activity change, appetite change, chills, diaphoresis, fatigue, fever and unexpected weight change.  HENT: Negative for congestion, postnasal drip, sinus pressure, sore throat, trouble swallowing and voice change.   Eyes: Negative for visual disturbance.  Respiratory: Negative for cough and shortness of breath.    Cardiovascular: Negative for chest pain, palpitations and leg swelling.  Gastrointestinal: Negative for abdominal distention, abdominal pain, blood in stool, constipation and diarrhea.       LLQ bulge that developed Oct. 2017.  Endocrine: Negative for polyuria.  Genitourinary: Negative for difficulty urinating, flank pain and pelvic pain.  Musculoskeletal: Negative for arthralgias, gait problem, neck pain and neck stiffness.  Skin: Negative for color change, pallor, rash and wound.  Neurological: Positive for headaches. Negative for dizziness, speech difficulty and light-headedness.  Psychiatric/Behavioral: Negative for agitation, behavioral problems and self-injury. The patient is not nervous/anxious and is not hyperactive.        Objective:   Physical Exam  Constitutional: She is oriented to person, place, and time. She appears well-developed and well-nourished. No distress.  HENT:  Head: Normocephalic and atraumatic.  Right Ear: External ear normal.  Left Ear: External ear normal.  Eyes: Conjunctivae and EOM are normal. Pupils are equal, round, and reactive to light.  Neck: Normal range of motion. Neck supple. No tracheal deviation present. No thyromegaly present.  Cardiovascular: Normal rate, regular rhythm, normal heart sounds and intact distal pulses.   No murmur heard. Pulmonary/Chest: Effort normal and breath sounds normal. No respiratory distress. She has no wheezes. She has no rales. She exhibits no tenderness.  Abdominal: Soft. Bowel sounds are normal. She exhibits no distension and no mass. There is no hepatosplenomegaly, splenomegaly or hepatomegaly. There is tenderness. There is no rigidity, no rebound, no guarding, no CVA tenderness, no tenderness at McBurney's point and negative Murphy's sign. A hernia is present. Hernia confirmed positive in the left inguinal area.  Musculoskeletal: Normal range of motion.  Lymphadenopathy:    She has no cervical adenopathy.   Neurological: She is alert and oriented to person, place, and time. She has normal reflexes.  Skin: Skin is warm and dry. She is not diaphoretic.  Psychiatric: She has a normal mood and affect. Her behavior is normal. Judgment and thought content normal.          Assessment & Plan:   1. Elevated cholesterol   2. Left inguinal hernia   3. Chronic tension-type headache, not intractable     Elevated cholesterol Continue Atorvastatin.  Continue diet low in saturated fat.  Will call with results of Lipid panel.  Chronic tension-type headache, not intractable Increase water intake.  Currently estimates minimal water intake and consumes a lot pf decaf coffee throughout day.  OTC Ibuprofen as needed for pain.   Practice stress reduction techniques.   Left inguinal hernia Avoid heavy lifting.  Use good body mechanics consistently.  Abdominal US order placed.  Possible referral to General Surgery after imaging completed.  If hernia becomes non-reducable or extremely painful, seek immediate medical attention.     FOLLOW-UP:  Return in about 1 year (around 06/08/2017) for Regular Follow Up.

## 2016-06-08 NOTE — Patient Instructions (Signed)
Hernia Introduction A hernia happens when an organ or tissue inside your body pushes out through a weak spot in the belly (abdomen). Follow these instructions at home:  Avoid stretching or overusing (straining) the muscles near the hernia.  Do not lift anything heavier than 10 lb (4.5 kg).  Use the muscles in your leg when you lift something up. Do not use the muscles in your back.  When you cough, try to cough gently.  Eat a diet that has a lot of fiber. Eat lots of fruits and vegetables.  Drink enough fluids to keep your pee (urine) clear or pale yellow. Try to drink 6-8 glasses of water a day.  Take medicines to make your poop soft (stool softeners) as told by your doctor.  Lose weight, if you are overweight.  Do not use any tobacco products, including cigarettes, chewing tobacco, or electronic cigarettes. If you need help quitting, ask your doctor.  Keep all follow-up visits as told by your doctor. This is important. Contact a doctor if:  The skin by the hernia gets puffy (swollen) or red.  The hernia is painful. Get help right away if:  You have a fever.  You have belly pain that is getting worse.  You feel sick to your stomach (nauseous) or you throw up (vomit).  You cannot push the hernia back in place by gently pressing on it while you are lying down.  The hernia:  Changes in shape or size.  Is stuck outside your belly.  Changes color.  Feels hard or tender. This information is not intended to replace advice given to you by your health care provider. Make sure you discuss any questions you have with your health care provider. Document Released: 10/15/2009 Document Revised: 10/03/2015 Document Reviewed: 03/07/2014  2017 Elsevier Cholesterol Cholesterol is a fat. Your body needs a small amount of cholesterol. Cholesterol (plaque) may build up in your blood vessels (arteries). That makes you more likely to have a heart attack or stroke. You cannot feel your  cholesterol level. Having a blood test is the only way to find out if your level is high. Keep your test results. Work with your doctor to keep your cholesterol at a good level. What do the results mean?  Total cholesterol is how much cholesterol is in your blood.  LDL is bad cholesterol. This is the type that can build up. Try to have low LDL.  HDL is good cholesterol. It cleans your blood vessels and carries LDL away. Try to have high HDL.  Triglycerides are fat that the body can store or burn for energy. What are good levels of cholesterol?  Total cholesterol below 200.  LDL below 100 is good for people who have health risks. LDL below 70 is good for people who have very high risks.  HDL above 40 is good. It is best to have HDL of 60 or higher.  Triglycerides below 150. How can I lower my cholesterol? Diet  Follow your diet program as told by your doctor.  Choose fish, white meat chicken, or [place] that is roasted or baked. Try not to eat red meat, fried foods, sausage, or lunch meats.  Eat lots of fresh fruits and vegetables.  Choose whole grains, beans, pasta, potatoes, and cereals.  Choose olive oil, corn oil, or canola oil. Only use small amounts.  Try not to eat butter, mayonnaise, shortening, or palm kernel oils.  Try not to eat foods with trans fats.  Choose low-fat or  nonfat dairy foods.  Drink skim or nonfat milk.  Eat low-fat or nonfat yogurt and cheeses.  Try not to drink whole milk or cream.  Try not to eat ice cream, egg yolks, or full-fat cheeses.  Healthy desserts include angel food cake, ginger snaps, animal crackers, hard candy, popsicles, and low-fat or nonfat frozen yogurt. Try not to eat pastries, cakes, pies, and cookies. Exercise  Follow your exercise program as told by your doctor.  Be more active. Try gardening, walking, and taking the stairs.  Ask your doctor about ways that you can be more active. Medicine  Take over-the-counter and  prescription medicines only as told by your doctor. This information is not intended to replace advice given to you by your health care provider. Make sure you discuss any questions you have with your health care provider. Document Released: 07/24/2008 Document Revised: 11/27/2015 Document Reviewed: 11/07/2015 Elsevier Interactive Patient Education  2017 [employer].   Cholesterol screening lab work obtained today, will call with results. Continue current medications as directed. Ultrasound order placed.  If hernia detected, will refer to General Surgeon. Please return annually, or sooner if needed.

## 2016-06-08 NOTE — Assessment & Plan Note (Signed)
Continue Atorvastatin.  Continue diet low in saturated fat.  Will call with results of Lipid panel.

## 2016-06-08 NOTE — Assessment & Plan Note (Signed)
Increase water intake.  Currently estimates minimal water intake and consumes a lot pf decaf coffee throughout day.  OTC Ibuprofen as needed for pain.   Practice stress reduction techniques.

## 2016-06-08 NOTE — Assessment & Plan Note (Signed)
Avoid heavy lifting.  Use good body mechanics consistently.  Abdominal US order placed.  Possible referral to General Surgery after imaging completed.  If hernia becomes non-reducable or extremely painful, seek immediate medical attention.

## 2016-06-09 LAB — LIPID PANEL
Chol/HDL Ratio: 3 ratio units (ref 0.0–4.4)
Cholesterol, Total: 163 mg/dL (ref 100–199)
HDL: 55 mg/dL (ref >39)
LDL Calculated: 93 mg/dL (ref 0–99)
Triglycerides: 77 mg/dL (ref 0–149)
VLDL Cholesterol Cal: 15 mg/dL (ref 5–40)

## 2016-06-10 ENCOUNTER — Telehealth: Payer: Self-pay | Admitting: Adult Health

## 2016-06-10 NOTE — Telephone Encounter (Signed)
Patient called back late afternoon about lab results. She states that when you attempt to reach her again during the next business day to leave the finding on her VM

## 2016-06-11 ENCOUNTER — Other Ambulatory Visit: Payer: PRIVATE HEALTH INSURANCE

## 2016-06-11 NOTE — Telephone Encounter (Signed)
LVM with test results per pt request.  Charyl Bigger, CMA

## 2016-06-15 ENCOUNTER — Ambulatory Visit
Admission: RE | Admit: 2016-06-15 | Discharge: 2016-06-15 | Disposition: A | Discharge status: Home / Self Care | Payer: PRIVATE HEALTH INSURANCE | Source: Ambulatory Visit | Attending: Family Medicine | Admitting: Family Medicine

## 2016-06-15 DIAGNOSIS — K4090 Unilateral inguinal hernia, without obstruction or gangrene, not specified as recurrent: Secondary | ICD-10-CM

## 2016-06-17 ENCOUNTER — Telehealth: Payer: Self-pay | Admitting: Adult Health

## 2016-06-17 ENCOUNTER — Other Ambulatory Visit: Payer: Self-pay

## 2016-06-17 DIAGNOSIS — K4090 Unilateral inguinal hernia, without obstruction or gangrene, not specified as recurrent: Secondary | ICD-10-CM

## 2016-06-17 NOTE — Telephone Encounter (Signed)
Hi [name],  Patient/ Monica Wyatt called to say she wants Dr. Sherren Mocha [facility] to perform her procedure-- She wants an late afternoon appointment if that can be arranged.Please call her when Appointment has been  Secured.  Thanks !  [name]

## 2016-06-18 ENCOUNTER — Ambulatory Visit: Payer: PRIVATE HEALTH INSURANCE | Admitting: Family Medicine

## 2016-07-03 ENCOUNTER — Ambulatory Visit: Payer: Self-pay | Admitting: *Deleted

## 2016-07-03 VITALS — BP 104/64 | HR 73 | Temp 99.1°F

## 2016-07-03 DIAGNOSIS — R059 Cough, unspecified: Secondary | ICD-10-CM

## 2016-07-03 DIAGNOSIS — R05 Cough: Secondary | ICD-10-CM

## 2016-07-03 NOTE — Progress Notes (Signed)
Pt walk in c/o "deep" cough, non-productive x1 week, worsening in frequency and "deepness" since yesterday. Mild sore throat. Majority of soreness is in central chest where she points to to be the source of her cough. Denies fever/chills, sinus pain/pressure, rhinorrhea. No OTCs. Encouraged to use Mucinex/Guafinesin during the day and Delsym or Robitussin at night. Contact RN if sx worsened or not improved over the weekend for appt with NP next clinic day. Pt verbalizes understanding and agreement with plan of care.

## 2016-10-06 LAB — BASIC METABOLIC PANEL
BUN: 18 (ref 4–21)
Creatinine: 0.9 (ref <=1.1)
Glucose: 95
POTASSIUM: 4.4 (ref 3.4–5.3)
SODIUM: 143 (ref 137–147)

## 2016-10-06 LAB — CBC AND DIFFERENTIAL
HEMATOCRIT: 41 (ref 36–46)
HEMOGLOBIN: 13.1 (ref 12.0–16.0)
Platelets: 288 (ref 150–399)
WBC: 4.3

## 2016-10-06 LAB — HEPATIC FUNCTION PANEL
ALK PHOS: 74 (ref 25–125)
ALT: 17 (ref 7–35)
AST: 18 (ref 13–35)
Bilirubin, Total: 0.2

## 2016-10-06 LAB — TSH: TSH: 3.13 (ref <=5.90)

## 2016-10-06 LAB — HM MAMMOGRAPHY

## 2016-10-06 LAB — HEMOGLOBIN A1C: Hemoglobin A1C: 5.6

## 2016-10-06 LAB — VITAMIN D 25 HYDROXY (VIT D DEFICIENCY, FRACTURES): Vit D, 25-Hydroxy: 49

## 2017-02-02 ENCOUNTER — Encounter: Payer: Self-pay | Admitting: Adult Health

## 2017-02-02 ENCOUNTER — Other Ambulatory Visit: Payer: Self-pay | Admitting: Adult Health

## 2017-02-02 ENCOUNTER — Ambulatory Visit (INDEPENDENT_AMBULATORY_CARE_PROVIDER_SITE_OTHER): Payer: PRIVATE HEALTH INSURANCE | Admitting: Adult Health

## 2017-02-02 VITALS — BP 92/60 | HR 73 | Ht 64.0 in | Wt 130.9 lb

## 2017-02-02 DIAGNOSIS — E7800 Pure hypercholesterolemia, unspecified: Secondary | ICD-10-CM

## 2017-02-02 DIAGNOSIS — Z23 Encounter for immunization: Secondary | ICD-10-CM

## 2017-02-02 MED ORDER — ATORVASTATIN CALCIUM 20 MG PO TABS: 20.0000 mg | ORAL_TABLET | Freq: Every day | ORAL | 0 refills | Status: DC

## 2017-02-02 NOTE — Progress Notes (Signed)
Subjective:    Patient ID: Monica Wyatt, female    DOB: [birth year], 61 y.o.   MRN: [id number]  HPI:  06/08/2016 OV Note: Ms.Kestenbaum presents to establish as a new pt.  She feel well generally, with only acute issues of "tension HA", and left lower abdominal pain with "a bulge".  She has had HAs  On/off for years and feels that they are triggered by work stress.  She denies dizziness/vision changes or N/V during HA.  She also reports Oct 2017 she may have "lifted something heavy at work" and she developed LLQ "puffiness, like a bulge".  She reports that the bulge is intermittent, can be "pushed back in", and only caused minor aching at times.  She denies previous hernia's.  She denies diffuse abdominal pain or blood in stool.    Today's OV Note: Appt cancelled-NO CHARGE  Patient Care Team    Relationship Specialty Notifications Start End  Esaw Grandchild, NP PCP - General Family Medicine  06/08/16   [name], MD Consulting Physician Dermatology  06/08/16   Aloha Gell, MD Consulting Physician Obstetrics and Gynecology  06/08/16     Patient Active Problem List   Diagnosis Date Noted  . Elevated cholesterol 06/08/2016  . Left inguinal hernia 06/08/2016  . Chronic tension-type headache, not intractable 06/08/2016     Past Medical History:  Diagnosis Date  . Headache(784.0)   . Hyperlipidemia   . Medical history non-contributory   . Osteoporosis      Past Surgical History:  Procedure Laterality Date  . HYSTEROSCOPY W/D&C N/A 07/22/2012   Procedure: DILATATION AND CURETTAGE /HYSTEROSCOPY;  Surgeon: [name]. [name], MD;  Location: [facility];  Service: Gynecology;  Laterality: N/A;  . LAPAROSCOPY N/A 07/22/2012   Procedure: LAPAROSCOPY OPERATIVE;  Surgeon: [name] [name], MD;  Location: [facility];  Service: Gynecology;  Laterality: N/A;  . NO PAST SURGERIES    . SALPINGOOPHORECTOMY Bilateral 07/22/2012   Procedure: SALPINGO OOPHORECTOMY;  Surgeon: [name]. [name], MD;   Location: [facility];  Service: Gynecology;  Laterality: Bilateral;     Family History  Problem Relation Age of Onset  . Dementia Mother   . COPD Father   . Healthy Sister   . Alcohol abuse Brother   . Sleep apnea Son   . Healthy Sister      History  Drug Use No     History  Alcohol Use No     History  Smoking Status  . Never Smoker  Smokeless Tobacco  . Never Used     Outpatient Encounter Prescriptions as of 02/02/2017  Medication Sig  . alendronate (FOSAMAX) 70 MG tablet Take 1 tablet by mouth once a week.  [name] atorvastatin (LIPITOR) 20 MG tablet Take 1 tablet (20 mg total) by mouth daily.  [name] ibuprofen (ADVIL) 200 MG tablet Take 3 tablets (600 mg total) by mouth every 6 (six) hours as needed for pain.   No facility-administered encounter medications on file as of 02/02/2017.     Allergies: Penicillins  There is no height or weight on file to calculate BMI.  There were no vitals taken for this visit.      FOLLOW-UP:  No Follow-up on file.   Review of Systems  Constitutional: Negative for activity change, appetite change, chills, diaphoresis, fatigue, fever and unexpected weight change.  HENT: Negative for congestion, postnasal drip, sinus pressure, sore throat, trouble swallowing and voice change.   Eyes: Negative for visual disturbance.  Respiratory: Negative for  cough and shortness of breath.   Cardiovascular: Negative for chest pain, palpitations and leg swelling.  Gastrointestinal: Negative for abdominal distention, abdominal pain, blood in stool, constipation and diarrhea.       LLQ bulge that developed Oct. 2017.  Endocrine: Negative for polyuria.  Genitourinary: Negative for difficulty urinating, flank pain and pelvic pain.  Musculoskeletal: Negative for arthralgias, gait problem, neck pain and neck stiffness.  Skin: Negative for color change, pallor, rash and wound.  Neurological: Positive for headaches. Negative for dizziness, speech difficulty  and light-headedness.  Psychiatric/Behavioral: Negative for agitation, behavioral problems and self-injury. The patient is not nervous/anxious and is not hyperactive.        Objective:   Physical Exam  Constitutional: She is oriented to person, place, and time. She appears well-developed and well-nourished. No distress.  HENT:  Head: Normocephalic and atraumatic.  Right Ear: External ear normal.  Left Ear: External ear normal.  Eyes: Pupils are equal, round, and reactive to light. Conjunctivae and EOM are normal.  Neck: Normal range of motion. Neck supple. No tracheal deviation present. No thyromegaly present.  Cardiovascular: Normal rate, regular rhythm, normal heart sounds and intact distal pulses.   No murmur heard. Pulmonary/Chest: Effort normal and breath sounds normal. No respiratory distress. She has no wheezes. She has no rales. She exhibits no tenderness.  Abdominal: Soft. Bowel sounds are normal. She exhibits no distension and no mass. There is no hepatosplenomegaly, splenomegaly or hepatomegaly. There is tenderness. There is no rigidity, no rebound, no guarding, no CVA tenderness, no tenderness at McBurney's point and negative Murphy's sign. A hernia is present. Hernia confirmed positive in the left inguinal area.  Musculoskeletal: Normal range of motion.  Lymphadenopathy:    She has no cervical adenopathy.  Neurological: She is alert and oriented to person, place, and time. She has normal reflexes.  Skin: Skin is warm and dry. She is not diaphoretic.  Psychiatric: She has a normal mood and affect. Her behavior is normal. Judgment and thought content normal.          Assessment & Plan:   No diagnosis found.  No problem-specific Assessment & Plan notes found for this encounter.    FOLLOW-UP:  No Follow-up on file.

## 2017-02-05 ENCOUNTER — Other Ambulatory Visit (INDEPENDENT_AMBULATORY_CARE_PROVIDER_SITE_OTHER): Payer: PRIVATE HEALTH INSURANCE

## 2017-02-05 DIAGNOSIS — E7800 Pure hypercholesterolemia, unspecified: Secondary | ICD-10-CM

## 2017-02-05 DIAGNOSIS — Z23 Encounter for immunization: Secondary | ICD-10-CM | POA: Diagnosis not present

## 2017-02-05 DIAGNOSIS — Z0000 Encounter for general adult medical examination without abnormal findings: Secondary | ICD-10-CM

## 2017-02-05 NOTE — Addendum Note (Signed)
Addended by: Fonnie Mu on: 02/05/2017 08:13 AM   Modules accepted: Level of Service

## 2017-02-06 LAB — COMPREHENSIVE METABOLIC PANEL
A/G RATIO: 1.8 (ref 1.2–2.2)
ALBUMIN: 4.5 g/dL (ref 3.6–4.8)
ALT: 18 IU/L (ref 0–32)
AST: 23 IU/L (ref 0–40)
Alkaline Phosphatase: 71 IU/L (ref 39–117)
BUN / CREAT RATIO: 17 (ref 12–28)
BUN: 17 mg/dL (ref 8–27)
Bilirubin Total: 0.4 mg/dL (ref 0.0–1.2)
CO2: 24 mmol/L (ref 20–29)
Calcium: 10.2 mg/dL (ref 8.7–10.3)
Chloride: 101 mmol/L (ref 96–106)
Creatinine, Ser: 1.02 mg/dL — ABNORMAL HIGH (ref 0.57–1.00)
GFR, EST AFRICAN AMERICAN: 69 mL/min/{1.73_m2} (ref >59)
GFR, EST NON AFRICAN AMERICAN: 60 mL/min/{1.73_m2} (ref >59)
GLOBULIN, TOTAL: 2.5 g/dL (ref 1.5–4.5)
GLUCOSE: 89 mg/dL (ref 65–99)
POTASSIUM: 5.7 mmol/L — AB (ref 3.5–5.2)
SODIUM: 143 mmol/L (ref 134–144)
TOTAL PROTEIN: 7 g/dL (ref 6.0–8.5)

## 2017-02-06 LAB — CBC WITH DIFFERENTIAL/PLATELET
BASOS ABS: 0.1 10*3/uL (ref 0.0–0.2)
BASOS: 2 %
EOS (ABSOLUTE): 0.1 10*3/uL (ref 0.0–0.4)
Eos: 3 %
Hematocrit: 38.9 % (ref 34.0–46.6)
Hemoglobin: 13.5 g/dL (ref 11.1–15.9)
Immature Grans (Abs): 0 10*3/uL (ref 0.0–0.1)
Immature Granulocytes: 0 %
LYMPHS ABS: 1.5 10*3/uL (ref 0.7–3.1)
Lymphs: 44 %
MCH: 31.3 pg (ref 26.6–33.0)
MCHC: 34.7 g/dL (ref 31.5–35.7)
MCV: 90 fL (ref 79–97)
MONOS ABS: 0.4 10*3/uL (ref 0.1–0.9)
Monocytes: 13 %
NEUTROS ABS: 1.3 10*3/uL — AB (ref 1.4–7.0)
Neutrophils: 38 %
PLATELETS: 233 10*3/uL (ref 150–379)
RBC: 4.31 x10E6/uL (ref 3.77–5.28)
RDW: 13.7 % (ref 12.3–15.4)
WBC: 3.4 10*3/uL (ref 3.4–10.8)

## 2017-02-06 LAB — HEMOGLOBIN A1C
Est. average glucose Bld gHb Est-mCnc: 117 mg/dL
Hgb A1c MFr Bld: 5.7 % — ABNORMAL HIGH (ref 4.8–5.6)

## 2017-02-06 LAB — LIPID PANEL
CHOL/HDL RATIO: 2.9 ratio (ref 0.0–4.4)
Cholesterol, Total: 163 mg/dL (ref 100–199)
HDL: 56 mg/dL (ref >39)
LDL CALC: 94 mg/dL (ref 0–99)
Triglycerides: 64 mg/dL (ref 0–149)
VLDL CHOLESTEROL CAL: 13 mg/dL (ref 5–40)

## 2017-02-06 LAB — VITAMIN B12: Vitamin B-12: 598 pg/mL (ref 232–1245)

## 2017-02-06 LAB — TSH: TSH: 4.85 u[IU]/mL — AB (ref 0.450–4.500)

## 2017-02-06 LAB — VITAMIN D 25 HYDROXY (VIT D DEFICIENCY, FRACTURES): VIT D 25 HYDROXY: 42.3 ng/mL (ref 30.0–100.0)

## 2017-02-14 ENCOUNTER — Other Ambulatory Visit: Payer: Self-pay | Admitting: Adult Health

## 2017-02-14 DIAGNOSIS — R7989 Other specified abnormal findings of blood chemistry: Secondary | ICD-10-CM

## 2017-02-14 DIAGNOSIS — R739 Hyperglycemia, unspecified: Secondary | ICD-10-CM

## 2017-02-15 ENCOUNTER — Other Ambulatory Visit: Payer: Self-pay

## 2017-02-15 MED ORDER — ATORVASTATIN CALCIUM 20 MG PO TABS: 20.0000 mg | ORAL_TABLET | Freq: Every day | ORAL | 1 refills | Status: DC

## 2017-03-18 DIAGNOSIS — M810 Age-related osteoporosis without current pathological fracture: Secondary | ICD-10-CM | POA: Insufficient documentation

## 2017-04-06 ENCOUNTER — Ambulatory Visit: Payer: Self-pay | Admitting: Registered Nurse

## 2017-04-06 VITALS — BP 105/70 | HR 83 | Temp 98.8°F

## 2017-04-06 DIAGNOSIS — L309 Dermatitis, unspecified: Secondary | ICD-10-CM

## 2017-04-06 DIAGNOSIS — R12 Heartburn: Secondary | ICD-10-CM

## 2017-04-06 DIAGNOSIS — R0789 Other chest pain: Secondary | ICD-10-CM

## 2017-04-06 NOTE — Progress Notes (Signed)
Subjective:    Patient ID: Monica Wyatt, female    DOB: [birth year], 61 y.o.   MRN: [id number]  61y/o cauasian female established Pt concerned about R chest pain that intermittently extends across mid chest. Has been occurring intermittently over last few months with certain movements or deep breathing.  Pain is deep in her chest hasn't noticed anything tender to touch. Sore, pulling sensation when it occurs. Infrequently, maybe 2 or 3 times, it has shot across her chest, lasting less than one minute.  Thinks she pulled a muscle but wanted to make sure it wasn't her heart.   Has taken tums prn for indigestion and resolves symptoms.  Sees dermatology for rosacea was given a tube of cream to put on rash left neck and it resolved after gardening/working in yard a couple months ago.  Now on right side of neck c/o "breaking out" around neck and shoulders over past few months. Unsure if this is related to pain. Hasn't changed laundry detergent, lotions, perfumes, body care products.  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      Review of Systems  Constitutional: Negative for activity change, appetite change, chills, diaphoresis, fatigue and fever.  HENT: Negative for congestion, drooling, ear pain, facial swelling, rhinorrhea, sore throat, trouble swallowing and voice change.   Eyes: Negative for photophobia, pain, discharge, redness, itching and visual disturbance.  Respiratory: Negative for cough, choking, chest tightness, shortness of breath, wheezing and stridor.   Cardiovascular: Positive for chest pain. Negative for palpitations and leg swelling.  Gastrointestinal: Negative for abdominal distention, abdominal pain, anal bleeding, blood in stool, constipation, diarrhea, nausea and vomiting.  Endocrine: Negative for cold intolerance and heat intolerance.  Genitourinary: Negative for difficulty urinating, dysuria and hematuria.  Musculoskeletal: Positive for myalgias. Negative for arthralgias, back pain, gait  problem, joint swelling, neck pain and neck stiffness.  Skin: Positive for color change and rash. Negative for pallor and wound.  Allergic/Immunologic: Negative for environmental allergies and food allergies.  Neurological: Negative for dizziness, tremors, seizures, syncope, speech difficulty, weakness, light-headedness, numbness and headaches.  Hematological: Negative for adenopathy. Does not bruise/bleed easily.  Psychiatric/Behavioral: Negative for agitation, confusion and sleep disturbance. The patient is not nervous/anxious.        Objective:   Physical Exam  Constitutional: She is oriented to person, place, and time. Vital signs are normal. She appears well-developed and well-nourished. She is active and cooperative.  Non-toxic appearance. She does not have a sickly appearance. She does not appear ill. No distress.  HENT:  Head: Normocephalic and atraumatic.  Right Ear: Hearing, external ear and ear canal normal. A middle ear effusion is present.  Left Ear: Hearing, external ear and ear canal normal. A middle ear effusion is present.  Nose: Nose normal. No mucosal edema, rhinorrhea, nose lacerations, sinus tenderness, nasal deformity, septal deviation or nasal septal hematoma. No epistaxis.  No foreign bodies. Right sinus exhibits no maxillary sinus tenderness and no frontal sinus tenderness. Left sinus exhibits no maxillary sinus tenderness and no frontal sinus tenderness.  Mouth/Throat: Uvula is midline, oropharynx is clear and moist and mucous membranes are normal. Mucous membranes are not pale, not dry and not cyanotic. She does not have dentures. No oral lesions. No trismus in the jaw. Normal dentition. No dental abscesses, uvula swelling, lacerations or dental caries. No oropharyngeal exudate, posterior oropharyngeal edema, posterior oropharyngeal erythema or tonsillar abscesses.  Bilateral TMs air fluid level clear  Eyes: Conjunctivae, EOM and lids are normal. Pupils are equal, round,  and reactive to light. Right eye exhibits no chemosis, no discharge, no exudate and no hordeolum. No foreign body present in the right eye. Left eye exhibits no chemosis, no discharge, no exudate and no hordeolum. No foreign body present in the left eye. Right conjunctiva is not injected. Right conjunctiva has no hemorrhage. Left conjunctiva is not injected. Left conjunctiva has no hemorrhage. No scleral icterus. Right eye exhibits normal extraocular motion and no nystagmus. Left eye exhibits normal extraocular motion and no nystagmus. Right pupil is round and reactive. Left pupil is round and reactive. Pupils are equal.  Neck: Trachea normal, normal range of motion and phonation normal. Neck supple. No tracheal tenderness, no spinous process tenderness and no muscular tenderness present. No neck rigidity. No tracheal deviation, no edema, no erythema and normal range of motion present. No thyroid mass and no thyromegaly present.  No cough observed in exam room; spoke full sentences without difficulty  Cardiovascular: Normal rate, regular rhythm, S1 normal, S2 normal, normal heart sounds and intact distal pulses. PMI is not displaced. Exam reveals no gallop and no friction rub.  No murmur heard. Pulses:      Radial pulses are 2+ on the right side, and 2+ on the left side.  Pulmonary/Chest: Effort normal and breath sounds normal. No accessory muscle usage or stridor. No respiratory distress. She has no decreased breath sounds. She has no wheezes. She has no rhonchi. She has no rales. Chest wall is not dull to percussion. She exhibits no mass, no tenderness, no bony tenderness, no laceration, no crepitus, no edema, no deformity, no swelling and no retraction.    Grimaced with position change on exam table lying to sitting up  Abdominal: Soft. Normal appearance and bowel sounds are normal. She exhibits no shifting dullness, no distension, no pulsatile liver, no fluid wave, no abdominal bruit, no ascites, no  pulsatile midline mass and no mass. There is no hepatosplenomegaly. There is no tenderness. There is no rigidity, no rebound, no guarding, no CVA tenderness, no tenderness at McBurney's point and negative Murphy's sign. Hernia confirmed negative in the ventral area.  Normoactive bowel sounds x 4 quads; dull to percussion x 4 quads  Musculoskeletal: Normal range of motion. She exhibits tenderness. She exhibits no edema or deformity.       Right shoulder: Normal.       Left shoulder: Normal.       Right elbow: Normal.      Left elbow: Normal.       Right wrist: Normal.       Left wrist: Normal.       Right hip: Normal.       Left hip: Normal.       Right knee: Normal.       Left knee: Normal.       Cervical back: Normal.       Thoracic back: Normal.       Lumbar back: Normal.       Right hand: Normal.       Left hand: Normal.  Full AROM bilateral shoulders/c/t/l-spine/hips; strength 5/5 bilaterally upper and lower extremities  Lymphadenopathy:       Head (right side): No submental, no submandibular, no tonsillar, no preauricular, no posterior auricular and no occipital adenopathy present.       Head (left side): No submental, no submandibular, no tonsillar, no preauricular, no posterior auricular and no occipital adenopathy present.    She has no cervical adenopathy.  Right cervical: No superficial cervical, no deep cervical and no posterior cervical adenopathy present.      Left cervical: No superficial cervical, no deep cervical and no posterior cervical adenopathy present.  Neurological: She is alert and oriented to person, place, and time. She has normal strength. She is not disoriented. She displays no atrophy and no tremor. No cranial nerve deficit or sensory deficit. She exhibits normal muscle tone. She displays no seizure activity. Coordination and gait normal. GCS eye subscore is 4. GCS verbal subscore is 5. GCS motor subscore is 6.  Bilateral hand grasp equal 5/5; on/off exam  table; in/out of chair without difficulty; gait sure and steady in hallway  Skin: Skin is warm, dry and intact. Rash noted. No abrasion, no bruising, no burn, no ecchymosis, no laceration, no lesion, no petechiae and no purpura noted. Rash is macular, papular and maculopapular. Rash is not nodular, not pustular, not vesicular and not urticarial. She is not diaphoretic. There is erythema. No cyanosis. No pallor. Nails show no clubbing.     2 lesions fine scale macular erythema fine papules grouped nummular right anterior neck shoulder; dry nontender, less than 1 cm diameter, symmetrical  Psychiatric: She has a normal mood and affect. Her speech is normal and behavior is normal. Judgment and thought content normal. Cognition and memory are normal.  Nursing note and vitals reviewed.         Assessment & Plan:  A-dermatitis right shoulder, atypical chest pain  P-suspect contact dermatitis as others in department this past month with new rash that resolved with hydrocortisone topical course but patient history of rosacea and fair skinned.  Hydrocortisone 1% apply affected area BID x 7-14 days OTC UD x 4 given to patient from clinic stock.  See RN Hildred Alamin for more packets as needed or may purchase tube OTC.  Medication as directed.  Symptomatic therapy suggested.  Warm to cool water soaks and/or oatmeal baths.Discussed Asymmetrical, Border irregular, Changing color, Diameter enlarging especially greater than 1cm, Excoriation/Evolving lesion when performing self-skin exams.  Continue self-skin exams on regular basis, wear protective clothing and sunscreen at least 30 SPF.  Follow up with PCM/dermatology for skin biopsy/re-evaluation if lesion not improving/healing with plan of care. Call or return to clinic as needed if these symptoms worsen or fail to improve as anticipated.  Exitcare handout on dermatitis and actinic keratosis given to patient.  Patient verbalized agreement and understanding of treatment  plan and had no further questions at this time.   P2:  Avoidance and hand washing.  Continue tums po prn heartburn consider omeprazole DR 20mg  po daily X 14 days trial. Avoid ASA, NSAID's, caffeine, peppermints, trigger foods, alcohol and tobacco. OTC H2 blockers and/or antacids are often very helpful for PRN use.  However, for chronic or daily symptoms, prescription strength H2 blockers or a trial of PPI's should be used. Consider bland diet.  Patient should alert me if there are persistent symptoms, dysphagia, weight loss or GI bleeding (bright red or black). Follow up with clinic provider or PCM if symptoms persist despite therapy.  Exitcare handout on heartburn and chest pain given to patient.  Patient verbalized agreement and understanding of treatment plan and had no further questions at this time.   Right sided intermittent chest pain suspect muscle strain as worsening with position changes on exam table and heavy lifting at work.  Discussed EKG not available in [facility] clinic would need to be performed at PCM/UC/ED.  Tylenol 1000mg  po QID prn  pain.  Heat/ice 15 minutes QID prn.  Biofreeze topical QID prn pain given 4 UD from clinic stock. patient to monitor symptoms and follow up with PCM/U[facility]/ED if worsening.  Go to ER if worsening pain, dizziness, SOB, syncope, diaphoresis, radiation to jaw or down arm, visual changes, dyspnea, worst headache of life   Exitcare handouts on heartburn, peptic ulcer and chest pain given to patient.  Patient verbalized agreement and understanding of treatment plan and had no further questions at this time. ER precautions.

## 2017-04-07 MED ORDER — ACETAMINOPHEN 500 MG PO TABS: 1000.0000 mg | ORAL_TABLET | Freq: Four times a day (QID) | ORAL | 0 refills | Status: AC | PRN

## 2017-04-07 MED ORDER — CALCIUM CARBONATE ANTACID 1000 MG PO CHEW: CHEWABLE_TABLET | ORAL | Status: DC

## 2017-04-07 MED ORDER — HYDROCORTISONE 1 % EX LOTN: 1.0000 "application " | TOPICAL_LOTION | Freq: Two times a day (BID) | CUTANEOUS | 0 refills | Status: AC

## 2017-04-07 NOTE — Patient Instructions (Addendum)
Chest Wall Pain Chest wall pain is pain in or around the bones and muscles of your chest. Sometimes, an injury causes this pain. Sometimes, the cause may not be known. This pain may take several weeks or longer to get better. Follow these instructions at home: Pay attention to any changes in your symptoms. Take these actions to help with your pain:  Rest as told by your health care provider.  Avoid activities that cause pain. These include any activities that use your chest muscles or your abdominal and side muscles to lift heavy items.  If directed, apply ice to the painful area: ? Put ice in a plastic bag. ? Place a towel between your skin and the bag. ? Leave the ice on for 20 minutes, 2-3 times per day.  Take over-the-counter and prescription medicines only as told by your health care provider.  Do not use tobacco products, including cigarettes, chewing tobacco, and e-cigarettes. If you need help quitting, ask your health care provider.  Keep all follow-up visits as told by your health care provider. This is important.  Contact a health care provider if:  You have a fever.  Your chest pain becomes worse.  You have new symptoms. Get help right away if:  You have nausea or vomiting.  You feel sweaty or light-headed.  You have a cough with phlegm (sputum) or you cough up blood.  You develop shortness of breath. This information is not intended to replace advice given to you by your health care provider. Make sure you discuss any questions you have with your health care provider. Document Released: 04/27/2005 Document Revised: 09/05/2015 Document Reviewed: 07/23/2014 Elsevier Interactive Patient Education  2017 [employer]  Actinic Keratosis An actinic keratosis is a precancerous growth on the skin. This means that it could develop into skin cancer if it is not treated. About 1% of these growths (actinic keratoses) turn into skin cancer within one year if they are not  treated. It is important to have all of these growths evaluated to determine the best treatment approach. What are the causes? This condition is caused by getting too much ultraviolet (UV) radiation from the sun or other UV light sources. What increases the risk? The following factors may make you more likely to develop this condition:  Having light-colored skin and blue eyes.  Having blonde or red hair.  Spending a lot of time in the sun.  Inadequate skin protection when outdoors. This may include: ? Not using sunscreen properly. ? Not covering up skin that is exposed to sunlight.  Aging. The risk of developing an actinic keratosis increases with age.  What are the signs or symptoms? Actinic keratoses look like scaly, rough spots of skin.They can be as small as a pinhead or as big as a quarter. They may itch, hurt, or feel sensitive. In most cases, the growths become red. In some cases, they may be skin-colored, light tan, dark tan, pink, or a combination of any of these colors. There may be a small piece of pink or gray skin (skin tag) growing from the actinic keratosis. In some cases, it may be easier to notice actinic keratoses by feeling them, rather than seeing them. Actinic keratoses appear most often on areas of skin that get a lot of sun exposure, including the scalp, face, ears, lips, upper back, forearms, and the backs of the hands. Sometimes, actinic keratoses disappear, but many reappear a few days to a few weeks later. How is this diagnosed?  This condition is usually diagnosed with a physical exam. A tissue sample may be removed from the actinic keratosis and examined under a microscope (biopsy). How is this treated?  Treatment for this condition may include:  Scraping off the actinic keratosis (curettage).  Freezing the actinic keratosis with liquid nitrogen (cryosurgery). This causes the growth to eventually fall off the skin.  Applying medicated creams or gels to  destroy the cells in the growth.  Applying chemicals to the actinic keratosis to make the outer layers of skin peel off (chemical peel).  Photodynamic therapy. In this procedure, medicated cream is applied to the actinic keratosis. This cream increases your skin's sensitivity to light. Then, a strong light is aimed at the actinic keratosis to destroy cells in the growth.  Follow these instructions at home: Skin care  Apply cool, wet cloths (cool compresses) to the affected areas.  Do not scratch your skin.  Check your skin regularly for any growths, especially growths that: ? Start to itch or bleed. ? Change in size, shape, or color. Caring for the treated area  Keep the treated area clean and dry as told by your health care provider.  Do not apply any medicine, cream, or lotion to the treated area unless your health care provider tells you to do that.  Do not pick at blisters or try to break them open. This can cause infection and scarring.  If you have red or irritated skin after treatment, follow instructions from your health care provider about how to take care of the treated area. Make sure you: ? Wash your hands with soap and water before you change your bandage (dressing). If soap and water are not available, use hand sanitizer. ? Change your dressing as told by your health care provider.  If you have red or irritated skin after treatment, check your treated area every day for signs of infection. Check for: ? Swelling, pain, or more redness. ? Fluid or blood. ? Warmth. ? Pus or a bad smell. General instructions  Take over-the-counter and prescription medicines only as told by your health care provider.  Return to your normal activities as told by your health care provider. Ask your health care provider what activities are safe for you.  Do not use any tobacco products, such as cigarettes, chewing tobacco, and e-cigarettes. If you need help quitting, ask your health care  provider.  Have a skin exam done every year by a health care provider who is a skin conditions specialist (dermatologist).  Keep all follow-up visits as told by your health care provider. This is important. How is this prevented?  Do not get sunburns.  Try to avoid the sun between 10:00 a.m. and 4:00 p.m. This is when the UV light is the strongest.  Use a sunscreen or sunblock with SPF 30 (sun protection factor 30) or greater.  Apply sunscreen before you are exposed to sunlight, and reapply periodically as often as directed by the instructions on the sunscreen container.  Always wear sunglasses that have UV protection, and always wear hats and clothing to protect your skin from sunlight.  When possible, avoid medicines that increase your sensitivity to sunlight. These include: ? Certain antibiotic medicines. ? Certain water pills (diuretics). ? Certain prescription medicines that are used to treat acne (retinoids).  Do not use tanning beds or other indoor tanning devices. Contact a health care provider if:  You notice any changes or new growths on your skin.  You have swelling, pain,  or more redness around your treated area.  You have fluid or blood coming from your treated area.  Your treated area feels warm to the touch.  You have pus or a bad smell coming from your treated area.  You have a fever.  You have a blister that becomes large and painful. This information is not intended to replace advice given to you by your health care provider. Make sure you discuss any questions you have with your health care provider. Document Released: 07/24/2008 Document Revised: 12/27/2015 Document Reviewed: 01/05/2015 Elsevier Interactive Patient Education  2018 [employer] Dermatitis Dermatitis is redness, soreness, and swelling (inflammation) of the skin. Contact dermatitis is a reaction to certain substances that touch the skin. There are two types of contact  dermatitis:  Irritant contact dermatitis. This type is caused by something that irritates your skin, such as dry hands from washing them too much. This type does not require previous exposure to the substance for a reaction to occur. This type is more common.  Allergic contact dermatitis. This type is caused by a substance that you are allergic to, such as a nickel allergy or poison ivy. This type only occurs if you have been exposed to the substance (allergen) before. Upon a repeat exposure, your body reacts to the substance. This type is less common.  What are the causes? Many different substances can cause contact dermatitis. Irritant contact dermatitis is most commonly caused by exposure to:  Makeup.  Soaps.  Detergents.  Bleaches.  Acids.  Metal salts, such as nickel.  Allergic contact dermatitis is most commonly caused by exposure to:  Poisonous plants.  Chemicals.  Jewelry.  Latex.  Medicines.  Preservatives in products, such as clothing.  What increases the risk? This condition is more likely to develop in:  People who have jobs that expose them to irritants or allergens.  People who have certain medical conditions, such as asthma or eczema.  What are the signs or symptoms? Symptoms of this condition may occur anywhere on your body where the irritant has touched you or is touched by you. Symptoms include:  Dryness or flaking.  Redness.  Cracks.  Itching.  Pain or a burning feeling.  Blisters.  Drainage of small amounts of blood or clear fluid from skin cracks.  With allergic contact dermatitis, there may also be swelling in areas such as the eyelids, mouth, or genitals. How is this diagnosed? This condition is diagnosed with a medical history and physical exam. A patch skin test may be performed to help determine the cause. If the condition is related to your job, you may need to see an occupational medicine specialist. How is this  treated? Treatment for this condition includes figuring out what caused the reaction and protecting your skin from further contact. Treatment may also include:  Steroid creams or ointments. Oral steroid medicines may be needed in more severe cases.  Antibiotics or antibacterial ointments, if a skin infection is present.  Antihistamine lotion or an antihistamine taken by mouth to ease itching.  A bandage (dressing).  Follow these instructions at home: [place] your skin as needed.  Apply cool compresses to the affected areas.  Try taking a bath with: ? Epsom salts. Follow the instructions on the packaging. You can get these at your local pharmacy or grocery store. ? Baking soda. Pour a small amount into the bath as directed by your health care provider. ? Colloidal oatmeal. Follow the instructions on the  packaging. You can get this at your local pharmacy or grocery store.  Try applying baking soda paste to your skin. Stir water into baking soda until it reaches a paste-like consistency.  Do not scratch your skin.  Bathe less frequently, such as every other day.  Bathe in lukewarm water. Avoid using hot water. Medicines  Take or apply over-the-counter and prescription medicines only as told by your health care provider.  If you were prescribed an antibiotic medicine, take or apply your antibiotic as told by your health care provider. Do not stop using the antibiotic even if your condition starts to improve. General instructions  Keep all follow-up visits as told by your health care provider. This is important.  Avoid the substance that caused your reaction. If you do not know what caused it, keep a journal to try to track what caused it. Write down: ? What you eat. ? What cosmetic products you use. ? What you drink. ? What you wear in the affected area. This includes jewelry.  If you were given a dressing, take care of it as told by your health care provider.  This includes when to change and remove it. Contact a health care provider if:  Your condition does not improve with treatment.  Your condition gets worse.  You have signs of infection such as swelling, tenderness, redness, soreness, or warmth in the affected area.  You have a fever.  You have new symptoms. Get help right away if:  You have a severe headache, neck pain, or neck stiffness.  You vomit.  You feel very sleepy.  You notice red streaks coming from the affected area.  Your bone or joint underneath the affected area becomes painful after the skin has healed.  The affected area turns darker.  You have difficulty breathing. This information is not intended to replace advice given to you by your health care provider. Make sure you discuss any questions you have with your health care provider. Document Released: 04/24/2000 Document Revised: 10/03/2015 Document Reviewed: 09/12/2014 Elsevier Interactive Patient Education  2018 [place].  Peptic Ulcer A peptic ulcer is a sore in the lining of the esophagus (esophageal ulcer), the stomach (gastric ulcer), or the first part of the small intestine (duodenal ulcer). The ulcer causes gradual wearing away (erosion) into the deeper tissue. What are the causes? Normally, the lining of the stomach and the small intestine protects itself from the acid that digests food. The protective lining can be damaged by:  An infection caused by a germ (bacterium) called Helicobacter pylori or H. pylori.  Regular use of NSAIDs, such as ibuprofen or aspirin.  Rare tumors in the stomach, small intestine, or pancreas (Zollinger-Ellison syndrome).  What increases the risk? The following factors may make you more likely to develop this condition:  Smoking.  Having a family history of ulcer disease.  What are the signs or symptoms? Symptoms of this condition include:  Burning pain or gnawing in the area between the chest and the belly  button. The pain may be worse on an empty stomach and at night.  Heartburn.  Nausea and vomiting.  Bloating.  If the ulcer results in bleeding, it can cause:  Black, tarry stools.  Vomiting of bright red blood.  Vomiting of material that looks like coffee grounds.  How is this diagnosed? This condition may be diagnosed based on:  Medical history and physical exam.  Various tests or procedures, such as: ? Blood tests, stool tests, or breath  tests to check for the H. pylori bacterium. ? An X-ray exam (upper gastrointestinal series) of the esophagus, stomach, and small intestine. ? Upper endoscopy. The health care provider examines the esophagus, stomach, and small intestine using a small flexible tube that has a video camera at the end. ? Biopsy. A tissue sample is removed to be examined under a microscope.  How is this treated? Treatment for this condition may include:  Eliminating the cause of the ulcer, such as smoking or the use of NSAIDs or alcohol.  Medicines to reduce the amount of acid in your digestive tract.  Antibiotic medicines, if the ulcer is caused by the H. pylori bacterium.  An upper endoscopy to treat a bleeding ulcer.  Surgery, if the bleeding is severe or if the ulcer created a hole somewhere in the digestive system.  Follow these instructions at home:  Avoid alcohol and caffeine.  Do not use any tobacco products, such as cigarettes, chewing tobacco, and e-cigarettes. If you need help quitting, ask your health care provider.  Take over-the-counter and prescription medicines only as told by your health care provider. Do not use over-the-counter medicines in place of prescription medicines unless your health care provider approves.  Keep all follow-up visits as told by your health care provider. This is important. Contact a health care provider if:  Your symptoms do not improve within 7 days of starting treatment.  You have ongoing indigestion or  heartburn. Get help right away if:  You have sudden, sharp, or persistent pain in your abdomen.  You have bloody or dark black, tarry stools.  You vomit blood or material that looks like coffee grounds.  You become light-headed or you feel faint.  You become weak.  You become sweaty or clammy. This information is not intended to replace advice given to you by your health care provider. Make sure you discuss any questions you have with your health care provider. Document Released: 04/24/2000 Document Revised: 09/30/2015 Document Reviewed: 01/26/2015 Elsevier Interactive Patient Education  2018 [employer].   Heartburn Heartburn is a type of pain or discomfort that can happen in the throat or chest. It is often described as a burning pain. It may also cause a bad taste in the mouth. Heartburn may feel worse when you lie down or bend over, and it is often worse at night. Heartburn may be caused by stomach contents that move back up into the esophagus (reflux). Follow these instructions at home: Take these actions to decrease your discomfort and to help avoid complications. Diet  Follow a diet as recommended by your health care provider. This may involve avoiding foods and drinks such as: ? Coffee and tea (with or without caffeine). ? Drinks that contain alcohol. ? Energy drinks and sports drinks. ? Carbonated drinks or sodas. ? Chocolate and cocoa. ? Peppermint and mint flavorings. ? Garlic and onions. ? Horseradish. ? Spicy and acidic foods, including peppers, chili powder, curry powder, vinegar, hot sauces, and barbecue sauce. ? Citrus fruit juices and citrus fruits, such as oranges, lemons, and limes. ? Tomato-based foods, such as red sauce, chili, salsa, and pizza with red sauce. ? Fried and fatty foods, such as donuts, french fries, potato chips, and high-fat dressings. ? High-fat meats, such as hot dogs and fatty cuts of red and white meats, such as rib eye steak, sausage,  ham, and bacon. ? High-fat dairy items, such as whole milk, butter, and cream cheese.  Eat small, frequent meals instead of large  meals.  Avoid drinking large amounts of liquid with your meals.  Avoid eating meals during the 2-3 hours before bedtime.  Avoid lying down right after you eat.  Do not exercise right after you eat. General instructions  Pay attention to any changes in your symptoms.  Take over-the-counter and prescription medicines only as told by your health care provider. Do not take aspirin, ibuprofen, or other NSAIDs unless your health care provider told you to do so.  Do not use any tobacco products, including cigarettes, chewing tobacco, and e-cigarettes. If you need help quitting, ask your health care provider.  Wear loose-fitting clothing. Do not wear anything tight around your waist that causes pressure on your abdomen.  Raise (elevate) the head of your bed about 6 inches (15 cm).  Try to reduce your stress, such as with yoga or meditation. If you need help reducing stress, ask your health care provider.  If you are overweight, reduce your weight to an amount that is healthy for you. Ask your health care provider for guidance about a safe weight loss goal.  Keep all follow-up visits as told by your health care provider. This is important. Contact a health care provider if:  You have new symptoms.  You have unexplained weight loss.  You have difficulty swallowing, or it hurts to swallow.  You have wheezing or a persistent cough.  Your symptoms do not improve with treatment.  You have frequent heartburn for more than two weeks. Get help right away if:  You have pain in your arms, neck, jaw, teeth, or back.  You feel sweaty, dizzy, or light-headed.  You have chest pain or shortness of breath.  You vomit and your vomit looks like blood or coffee grounds.  Your stool is bloody or black. This information is not intended to replace advice given to you  by your health care provider. Make sure you discuss any questions you have with your health care provider. Document Released: 09/13/2008 Document Revised: 10/03/2015 Document Reviewed: 08/22/2014 Elsevier Interactive Patient Education  2017 [employer].

## 2017-08-17 ENCOUNTER — Other Ambulatory Visit: Payer: PRIVATE HEALTH INSURANCE

## 2017-08-17 ENCOUNTER — Other Ambulatory Visit: Payer: Self-pay

## 2017-08-17 DIAGNOSIS — R739 Hyperglycemia, unspecified: Secondary | ICD-10-CM

## 2017-08-17 DIAGNOSIS — R7989 Other specified abnormal findings of blood chemistry: Secondary | ICD-10-CM

## 2017-08-17 NOTE — Telephone Encounter (Signed)
Opened in error. T. Nelson, CMA 

## 2017-08-18 LAB — T4, FREE: Free T4: 1.01 ng/dL (ref 0.82–1.77)

## 2017-08-18 LAB — TSH: TSH: 3.81 u[IU]/mL (ref 0.450–4.500)

## 2017-08-18 LAB — HEMOGLOBIN A1C
Est. average glucose Bld gHb Est-mCnc: 120 mg/dL
HEMOGLOBIN A1C: 5.8 % — AB (ref 4.8–5.6)

## 2017-09-06 ENCOUNTER — Other Ambulatory Visit: Payer: Self-pay

## 2017-09-06 ENCOUNTER — Other Ambulatory Visit: Payer: Self-pay | Admitting: Adult Health

## 2017-09-06 NOTE — Progress Notes (Signed)
Opened in error. T. Marsela Kuan, CMA 

## 2017-10-06 ENCOUNTER — Ambulatory Visit: Payer: PRIVATE HEALTH INSURANCE | Admitting: Adult Health

## 2017-10-06 ENCOUNTER — Other Ambulatory Visit: Payer: Self-pay

## 2017-10-06 ENCOUNTER — Telehealth: Payer: Self-pay | Admitting: Adult Health

## 2017-10-06 ENCOUNTER — Other Ambulatory Visit: Payer: PRIVATE HEALTH INSURANCE

## 2017-10-06 DIAGNOSIS — E7800 Pure hypercholesterolemia, unspecified: Secondary | ICD-10-CM

## 2017-10-06 NOTE — Telephone Encounter (Signed)
Patient was to see Valetta Fuller today but we had to cancel the appt. She came in to at least do her lab work, she states she only has three days left of her cholesterol meds and wants to make sure she can get a refill sent to [facility]. Please advise

## 2017-10-07 LAB — LIPID PANEL
CHOLESTEROL TOTAL: 139 mg/dL (ref 100–199)
Chol/HDL Ratio: 2.7 ratio (ref 0.0–4.4)
HDL: 51 mg/dL (ref >39)
LDL CALC: 72 mg/dL (ref 0–99)
TRIGLYCERIDES: 80 mg/dL (ref 0–149)
VLDL CHOLESTEROL CAL: 16 mg/dL (ref 5–40)

## 2017-10-08 MED ORDER — ATORVASTATIN CALCIUM 20 MG PO TABS: 20.0000 mg | ORAL_TABLET | Freq: Every day | ORAL | 0 refills | Status: DC

## 2017-10-08 NOTE — Addendum Note (Signed)
Addended by: Fonnie Mu on: 10/08/2017 12:17 PM   Modules accepted: Orders

## 2017-10-14 ENCOUNTER — Ambulatory Visit (INDEPENDENT_AMBULATORY_CARE_PROVIDER_SITE_OTHER): Payer: PRIVATE HEALTH INSURANCE | Admitting: Adult Health

## 2017-10-14 ENCOUNTER — Encounter: Payer: Self-pay | Admitting: Adult Health

## 2017-10-14 VITALS — BP 93/53 | HR 74 | Ht 64.0 in | Wt 128.9 lb

## 2017-10-14 DIAGNOSIS — R7303 Prediabetes: Secondary | ICD-10-CM

## 2017-10-14 DIAGNOSIS — E7800 Pure hypercholesterolemia, unspecified: Secondary | ICD-10-CM

## 2017-10-14 DIAGNOSIS — Z1211 Encounter for screening for malignant neoplasm of colon: Secondary | ICD-10-CM

## 2017-10-14 DIAGNOSIS — M810 Age-related osteoporosis without current pathological fracture: Secondary | ICD-10-CM | POA: Diagnosis not present

## 2017-10-14 DIAGNOSIS — G4700 Insomnia, unspecified: Secondary | ICD-10-CM

## 2017-10-14 DIAGNOSIS — Z0000 Encounter for general adult medical examination without abnormal findings: Secondary | ICD-10-CM

## 2017-10-14 MED ORDER — ATORVASTATIN CALCIUM 20 MG PO TABS: 20.0000 mg | ORAL_TABLET | Freq: Every day | ORAL | 1 refills | Status: DC

## 2017-10-14 NOTE — Progress Notes (Signed)
Subjective:    Patient ID: Monica Wyatt, female    DOB: 1956/04/22, 62 y.o.   MRN: [id number]  HPI: 06/08/16 OV:  Monica Wyatt presents to establish as a new pt.  She feel well generally, with only acute issues of "tension HA", and left lower abdominal pain with "a bulge".  She has had HAs  On/off for years and feels that they are triggered by work stress.  She denies dizziness/vision changes or N/V during HA.  She also reports Oct 2017 she may have "lifted something heavy at work" and she developed LLQ "puffiness, like a bulge".  She reports that the bulge is intermittent, can be "pushed back in", and only caused minor aching at times.  She denies previous hernia's.  She denies diffuse abdominal pain or blood in stool.    10/14/17 OV: Monica Wyatt presents for f/u: HLD, and to discuss lab results from April 2019.  She denies acute complaints or dramatic changes in health since initial OV in Jan 2018 Last Lipid panel- The 10-year ASCVD risk score [name] DC Brooke Bonito., et al., 2013) is: 1.6%   Values used to calculate the score:     Age: 60 years     Sex: Female     Is Non-Hispanic African American: No     Diabetic: No     Tobacco smoker: No     Systolic Blood Pressure: 93 mmHg     Is BP treated: No     HDL Cholesterol: 51 mg/dL     Total Cholesterol: 139 mg/dL  LDL- 72  She is compliant on Atorvastatin 20mg  QD, denies myalgia's Successful hernia repair Spring 2018 She reports insomnia- difficulty falling.remaining asleep. She was unable to complete Colonoscopy (intolerate prep), requests order for Cologuard. She reports being started on alendronate (Fosamax) 70mg  once weekly by her OB/GYN >12 months ago  Patient Care Team    Relationship Specialty Notifications Start End  Mina Marble D, NP PCP - General Family Medicine  06/08/16   [name], MD Consulting Physician Dermatology  06/08/16   Aloha Gell, MD Consulting Physician Obstetrics and Gynecology  06/08/16     Patient Active  Problem List   Diagnosis Date Noted  . Healthcare maintenance 10/14/2017  . Insomnia 10/14/2017  . Pre-diabetes 10/14/2017  . Screening for colon cancer 10/14/2017  . Osteoporosis 03/18/2017  . Hyperlipidemia 06/08/2016  . Chronic tension-type headache, not intractable 06/08/2016     Past Medical History:  Diagnosis Date  . Headache(784.0)   . Hyperlipidemia   . Medical history non-contributory   . Osteoporosis      Past Surgical History:  Procedure Laterality Date  . HERNIA REPAIR    . HYSTEROSCOPY W/D&C N/A 07/22/2012   Procedure: DILATATION AND CURETTAGE /HYSTEROSCOPY;  Surgeon: [name]. [name], MD;  Location: [facility];  Service: Gynecology;  Laterality: N/A;  . LAPAROSCOPY N/A 07/22/2012   Procedure: LAPAROSCOPY OPERATIVE;  Surgeon: [name] [name], MD;  Location: [facility];  Service: Gynecology;  Laterality: N/A;  . NO PAST SURGERIES    . SALPINGOOPHORECTOMY Bilateral 07/22/2012   Procedure: SALPINGO OOPHORECTOMY;  Surgeon: [name]. [name], MD;  Location: [facility];  Service: Gynecology;  Laterality: Bilateral;     Family History  Problem Relation Age of Onset  . Dementia Mother   . COPD Father   . Healthy Sister   . Alcohol abuse Brother   . Sleep apnea Son   . Healthy Sister      Social History   Substance  and Sexual Activity  Drug Use No     Social History   Substance and Sexual Activity  Alcohol Use No     Social History   Tobacco Use  Smoking Status Never Smoker  Smokeless Tobacco Never Used     Outpatient Encounter Medications as of 10/14/2017  Medication Sig  . alendronate (FOSAMAX) 70 MG tablet Take 1 tablet by mouth once a week.  [name] atorvastatin (LIPITOR) 20 MG tablet Take 1 tablet (20 mg total) by mouth daily.  . calcium elemental as carbonate (BARIATRIC TUMS ULTRA) 400 MG chewable tablet Chew 2.5 tablets by mouth as needed every 8 hours as needed for heartburn  . Cholecalciferol (VITAMIN D3) 2000 units TABS Take 1 tablet by mouth daily.   [name] ibuprofen (ADVIL) 200 MG tablet Take 3 tablets (600 mg total) by mouth every 6 (six) hours as needed for pain.  . metroNIDAZOLE (METROGEL) 1 % gel   . [DISCONTINUED] atorvastatin (LIPITOR) 20 MG tablet Take 1 tablet (20 mg total) by mouth daily.   No facility-administered encounter medications on file as of 10/14/2017.     Allergies: Penicillins  Body mass index is 22.13 kg/m.  Blood pressure (!) 93/53, pulse 74, height 5\' 4"  (1.626 m), weight 128 lb 14.4 oz (58.5 kg), SpO2 96 %.   FOLLOW-UP:  Return in about 3 months (around 01/14/2018) for CPE.   Review of Systems  Constitutional: Negative for activity change, appetite change, chills, diaphoresis, fatigue, fever and unexpected weight change.  HENT: Negative for congestion, postnasal drip, sinus pressure, sore throat, trouble swallowing and voice change.   Eyes: Negative for visual disturbance.  Respiratory: Negative for cough and shortness of breath.   Cardiovascular: Negative for chest pain, palpitations and leg swelling.  Gastrointestinal: Negative for abdominal distention, abdominal pain, blood in stool, constipation and diarrhea.  Endocrine: Negative for polyuria.  Genitourinary: Negative for difficulty urinating, flank pain and pelvic pain.  Musculoskeletal: Negative for arthralgias, gait problem, neck pain and neck stiffness.  Skin: Negative for color change, pallor, rash and wound.  Neurological: Positive for headaches. Negative for dizziness, speech difficulty and light-headedness.  Psychiatric/Behavioral: Positive for sleep disturbance. Negative for agitation, behavioral problems, self-injury and suicidal ideas. The patient is not nervous/anxious and is not hyperactive.        Objective:   Physical Exam  Constitutional: She is oriented to person, place, and time. She appears well-developed and well-nourished. No distress.  HENT:  Head: Normocephalic and atraumatic.  Right Ear: External ear normal.  Left Ear:  External ear normal.  Eyes: Pupils are equal, round, and reactive to light. Conjunctivae and EOM are normal.  Neck: Neck supple.  Cardiovascular: Normal rate, regular rhythm, normal heart sounds and intact distal pulses.  No murmur heard. Pulmonary/Chest: Effort normal and breath sounds normal. No respiratory distress. She has no wheezes. She has no rales. She exhibits no tenderness.  Abdominal: Soft. Bowel sounds are normal.  Musculoskeletal: Normal range of motion.  Neurological: She is alert and oriented to person, place, and time. She has normal reflexes.  Skin: Skin is warm and dry. No rash noted. She is not diaphoretic. No erythema. No pallor.  Psychiatric: She has a normal mood and affect. Her behavior is normal. Judgment and thought content normal.  Nursing note and vitals reviewed.     Assessment & Plan:   1. Screening for colon cancer   2. Age-related osteoporosis without current pathological fracture   3. Elevated cholesterol   4. Healthcare maintenance  5. Insomnia, unspecified type   6. Pre-diabetes     Osteoporosis Started on Alendronate (Fosamax) 70mg  QD by OB/GYN >12 months Recommended to increase regular walking.  Hyperlipidemia The 10-year ASCVD risk score [name] DC Jr., et al., 2013) is: 1.6%   Values used to calculate the score:     Age: 32 years     Sex: Female     Is Non-Hispanic African American: No     Diabetic: No     Tobacco smoker: No     Systolic Blood Pressure: 93 mmHg     Is BP treated: No     HDL Cholesterol: 51 mg/dL     Total Cholesterol: 139 mg/dL  LDL-72 Currently on Atorvastatin 20mg  QD, denies myalgia's   Healthcare maintenance Please continue all medications as directed. Increase water intake, strive for at least 60 ounces/day. Follow Diabetic diet.  Increase regular exercise.  Recommend at least 30 minutes daily, 5 days per week of walking, jogging, biking, swimming, YouTube/Pinterest workout videos. Last A1c- 5.8, which is in  pre-diabetic- but you can lower it with increasing movement and reducing sugar and carbohydrate. Please follow-up with OB/GYN. Cologuard order placed. Please return in 3 months for a complete physical and A1c re-check  Insomnia Recommend OTC Melatonin 3mg  for insomnia.  Screening for colon cancer Intolerant to colon prep, therefore colonoscopy was aborted. Cologuard ordered.  Pre-diabetes Lab Results  Component Value Date   HGBA1C 5.8 (H) 08/17/2017   HGBA1C 5.7 (H) 02/05/2017   HGBA1C 5.6 10/06/2016   Follow diabetic diet and increase regular movement    FOLLOW-UP:  Return in about 3 months (around 01/14/2018) for CPE.

## 2017-10-14 NOTE — Patient Instructions (Addendum)
Diabetes Mellitus and Nutrition When you have diabetes (diabetes mellitus), it is very important to have healthy eating habits because your blood sugar (glucose) levels are greatly affected by what you eat and drink. Eating healthy foods in the appropriate amounts, at about the same times every day, can help you:  Control your blood glucose.  Lower your risk of heart disease.  Improve your blood pressure.  Reach or maintain a healthy weight.  Every person with diabetes is different, and each person has different needs for a meal plan. Your health care provider may recommend that you work with a diet and nutrition specialist (dietitian) to make a meal plan that is best for you. Your meal plan may vary depending on factors such as:  The calories you need.  The medicines you take.  Your weight.  Your blood glucose, blood pressure, and cholesterol levels.  Your activity level.  Other health conditions you have, such as heart or kidney disease.  How do carbohydrates affect me? Carbohydrates affect your blood glucose level more than any other type of food. Eating carbohydrates naturally increases the amount of glucose in your blood. Carbohydrate counting is a method for keeping track of how many carbohydrates you eat. Counting carbohydrates is important to keep your blood glucose at a healthy level, especially if you use insulin or take certain oral diabetes medicines. It is important to know how many carbohydrates you can safely have in each meal. This is different for every person. Your dietitian can help you calculate how many carbohydrates you should have at each meal and for snack. Foods that contain carbohydrates include:  Bread, cereal, rice, pasta, and crackers.  Potatoes and corn.  Peas, beans, and lentils.  Milk and yogurt.  Fruit and juice.  Desserts, such as cakes, cookies, ice cream, and candy.  How does alcohol affect me? Alcohol can cause a sudden decrease in blood  glucose (hypoglycemia), especially if you use insulin or take certain oral diabetes medicines. Hypoglycemia can be a life-threatening condition. Symptoms of hypoglycemia (sleepiness, dizziness, and confusion) are similar to symptoms of having too much alcohol. If your health care provider says that alcohol is safe for you, follow these guidelines:  Limit alcohol intake to no more than 1 drink per day for nonpregnant women and 2 drinks per day for men. One drink equals 12 oz of beer, 5 oz of wine, or 1 oz of hard liquor.  Do not drink on an empty stomach.  Keep yourself hydrated with water, diet soda, or unsweetened iced tea.  Keep in mind that regular soda, juice, and other mixers may contain a lot of sugar and must be counted as carbohydrates.  What are tips for following this plan? Reading food labels  Start by checking the serving size on the label. The amount of calories, carbohydrates, fats, and other nutrients listed on the label are based on one serving of the food. Many foods contain more than one serving per package.  Check the total grams (g) of carbohydrates in one serving. You can calculate the number of servings of carbohydrates in one serving by dividing the total carbohydrates by 15. For example, if a food has 30 g of total carbohydrates, it would be equal to 2 servings of carbohydrates.  Check the number of grams (g) of saturated and trans fats in one serving. Choose foods that have low or no amount of these fats.  Check the number of milligrams (mg) of sodium in one serving. Most people   should limit total sodium intake to less than 2,300 mg per day.  Always check the nutrition information of foods labeled as "low-fat" or "nonfat". These foods may be higher in added sugar or refined carbohydrates and should be avoided.  Talk to your dietitian to identify your daily goals for nutrients listed on the label. Shopping  Avoid buying canned, premade, or processed foods. These  foods tend to be high in fat, sodium, and added sugar.  Shop around the outside edge of the grocery store. This includes fresh fruits and vegetables, bulk grains, fresh meats, and fresh dairy. Cooking  Use low-heat cooking methods, such as baking, instead of high-heat cooking methods like deep frying.  Cook using healthy oils, such as olive, canola, or sunflower oil.  Avoid cooking with butter, cream, or high-fat meats. Meal planning  Eat meals and snacks regularly, preferably at the same times every day. Avoid going long periods of time without eating.  Eat foods high in fiber, such as fresh fruits, vegetables, beans, and whole grains. Talk to your dietitian about how many servings of carbohydrates you can eat at each meal.  Eat 4-6 ounces of lean protein each day, such as lean meat, chicken, fish, eggs, or tofu. 1 ounce is equal to 1 ounce of meat, chicken, or fish, 1 egg, or 1/4 cup of tofu.  Eat some foods each day that contain healthy fats, such as avocado, nuts, seeds, and fish. Lifestyle   Check your blood glucose regularly.  Exercise at least 30 minutes 5 or more days each week, or as told by your health care provider.  Take medicines as told by your health care provider.  Do not use any products that contain nicotine or tobacco, such as cigarettes and e-cigarettes. If you need help quitting, ask your health care provider.  Work with a counselor or diabetes educator to identify strategies to manage stress and any emotional and social challenges. What are some questions to ask my health care provider?  Do I need to meet with a diabetes educator?  Do I need to meet with a dietitian?  What number can I call if I have questions?  When are the best times to check my blood glucose? Where to find more information:  American Diabetes Association: diabetes.org/food-and-fitness/food  Academy of Nutrition and Dietetics:  www.eatright.org/resources/health/diseases-and-conditions/diabetes  National Institute of Diabetes and Digestive and Kidney Diseases (NIH): www.niddk.nih.gov/health-information/diabetes/overview/diet-eating-physical-activity Summary  A healthy meal plan will help you control your blood glucose and maintain a healthy lifestyle.  Working with a diet and nutrition specialist (dietitian) can help you make a meal plan that is best for you.  Keep in mind that carbohydrates and alcohol have immediate effects on your blood glucose levels. It is important to count carbohydrates and to use alcohol carefully. This information is not intended to replace advice given to you by your health care provider. Make sure you discuss any questions you have with your health care provider. Document Released: 01/22/2005 Document Revised: 06/01/2016 Document Reviewed: 06/01/2016 Elsevier Interactive Patient Education  2018 Elsevier Inc. Diabetes Mellitus and Exercise Exercising regularly is important for your overall health, especially when you have diabetes (diabetes mellitus). Exercising is not only about losing weight. It has many health benefits, such as increasing muscle strength and bone density and reducing body fat and stress. This leads to improved fitness, flexibility, and endurance, all of which result in better overall health. Exercise has additional benefits for people with diabetes, including:  Reducing appetite.  Helping to lower   to lower and control blood glucose.  Lowering blood pressure.  Helping to control amounts of fatty substances (lipids) in the blood, such as cholesterol and triglycerides.  Helping the body to respond better to insulin (improving insulin sensitivity).  Reducing how much insulin the body needs.  Decreasing the risk for heart disease by: ? Lowering cholesterol and triglyceride levels. ? Increasing the levels of good cholesterol. ? Lowering blood glucose levels.  What is my  activity plan? Your health care provider or certified diabetes educator can help you make a plan for the type and frequency of exercise (activity plan) that works for you. Make sure that you:  Do at least 150 minutes of moderate-intensity or vigorous-intensity exercise each week. This could be brisk walking, biking, or water aerobics. ? Do stretching and strength exercises, such as yoga or weightlifting, at least 2 times a week. ? Spread out your activity over at least 3 days of the week.  Get some form of physical activity every day. ? Do not go more than 2 days in a row without some kind of physical activity. ? Avoid being inactive for more than 90 minutes at a time. Take frequent breaks to walk or stretch.  Choose a type of exercise or activity that you enjoy, and set realistic goals.  Start slowly, and gradually increase the intensity of your exercise over time.  What do I need to know about managing my diabetes?  Check your blood glucose before and after exercising. ? If your blood glucose is higher than 240 mg/dL (13.3 mmol/L) before you exercise, check your urine for ketones. If you have ketones in your urine, do not exercise until your blood glucose returns to normal.  Know the symptoms of low blood glucose (hypoglycemia) and how to treat it. Your risk for hypoglycemia increases during and after exercise. Common symptoms of hypoglycemia can include: ? Hunger. ? Anxiety. ? Sweating and feeling clammy. ? Confusion. ? Dizziness or feeling light-headed. ? Increased heart rate or palpitations. ? Blurry vision. ? Tingling or numbness around the mouth, lips, or tongue. ? Tremors or shakes. ? Irritability.  Keep a rapid-acting carbohydrate snack available before, during, and after exercise to help prevent or treat hypoglycemia.  Avoid injecting insulin into areas of the body that are going to be exercised. For example, avoid injecting insulin into: ? The arms, when playing  tennis. ? The legs, when jogging.  Keep records of your exercise habits. Doing this can help you and your health care provider adjust your diabetes management plan as needed. Write down: ? Food that you eat before and after you exercise. ? Blood glucose levels before and after you exercise. ? The type and amount of exercise you have done. ? When your insulin is expected to peak, if you use insulin. Avoid exercising at times when your insulin is peaking.  When you start a new exercise or activity, work with your health care provider to make sure the activity is safe for you, and to adjust your insulin, medicines, or food intake as needed.  Drink plenty of water while you exercise to prevent dehydration or heat stroke. Drink enough fluid to keep your urine clear or pale yellow. This information is not intended to replace advice given to you by your health care provider. Make sure you discuss any questions you have with your health care provider. Document Released: 07/18/2003 Document Revised: 11/15/2015 Document Reviewed: 10/07/2015 Elsevier Interactive Patient Education  [employer].  Please continue all  medications as directed. Increase water intake, strive for at least 60 ounces/day.   Follow Diabetic diet.  Increase regular exercise.  Recommend at least 30 minutes daily, 5 days per week of walking, jogging, biking, swimming, YouTube/Pinterest workout videos. Last A1c- 5.8, which is in pre-diabetic- but you can lower it with increasing movement and reducing sugar and carbohydrate. Please follow-up with OB/GYN. Cologuard order placed. Recommend OTC Melatonin 3mg  for insomnia. Please return in 3 months for a complete physical and A1c re-check. NICE TO SEE YOU!

## 2017-10-14 NOTE — Assessment & Plan Note (Signed)
Recommend OTC Melatonin 3mg  for insomnia.

## 2017-10-14 NOTE — Assessment & Plan Note (Signed)
The 10-year ASCVD risk score [name] DC Brooke Bonito., et al., 2013) is: 1.6%   Values used to calculate the score:     Age: 62 years     Sex: Female     Is Non-Hispanic African American: No     Diabetic: No     Tobacco smoker: No     Systolic Blood Pressure: 93 mmHg     Is BP treated: No     HDL Cholesterol: 51 mg/dL     Total Cholesterol: 139 mg/dL  LDL-72 Currently on Atorvastatin 20mg  QD, denies myalgia's

## 2017-10-14 NOTE — Assessment & Plan Note (Signed)
Started on Alendronate (Fosamax) 70mg  QD by OB/GYN >12 months Recommended to increase regular walking.

## 2017-10-14 NOTE — Assessment & Plan Note (Signed)
Please continue all medications as directed. Increase water intake, strive for at least 60 ounces/day. Follow Diabetic diet.  Increase regular exercise.  Recommend at least 30 minutes daily, 5 days per week of walking, jogging, biking, swimming, YouTube/Pinterest workout videos. Last A1c- 5.8, which is in pre-diabetic- but you can lower it with increasing movement and reducing sugar and carbohydrate. Please follow-up with OB/GYN. Cologuard order placed. Please return in 3 months for a complete physical and A1c re-check

## 2017-10-14 NOTE — Assessment & Plan Note (Signed)
Lab Results  Component Value Date   HGBA1C 5.8 (H) 08/17/2017   HGBA1C 5.7 (H) 02/05/2017   HGBA1C 5.6 10/06/2016   Follow diabetic diet and increase regular movement

## 2017-10-14 NOTE — Assessment & Plan Note (Signed)
Intolerant to colon prep, therefore colonoscopy was aborted. Cologuard ordered.

## 2017-11-03 LAB — COLOGUARD: COLOGUARD: NEGATIVE

## 2017-12-01 LAB — HM MAMMOGRAPHY

## 2017-12-07 ENCOUNTER — Other Ambulatory Visit: Payer: Self-pay | Admitting: Obstetrics

## 2017-12-07 DIAGNOSIS — M810 Age-related osteoporosis without current pathological fracture: Secondary | ICD-10-CM

## 2018-01-18 NOTE — Progress Notes (Signed)
Subjective:    Patient ID: Monica Wyatt, female    DOB: 11-13-1955, 62 y.o.   MRN: [id number]  HPI:  10/14/17 OV: Ms. Monica Wyatt presents for f/u: HLD, and to discuss lab results from April 2019. She denies acute complaints or dramatic changes in health since initial OV in Jan 2018 Last Lipid panel- The 10-year ASCVD risk score [name] DC Brooke Bonito., et al., 2013) is: 1.6%   Values used to calculate the score:     Age: 21 years     Sex: Female     Is Non-Hispanic African American: No     Diabetic: No     Tobacco smoker: No     Systolic Blood Pressure: 93 mmHg     Is BP treated: No     HDL Cholesterol: 51 mg/dL     Total Cholesterol: 139 mg/dL  LDL- 72  She is compliant on Atorvastatin 20mg  QD, denies myalgia's Successful hernia repair Spring 2018 She reports insomnia- difficulty falling.remaining asleep. She was unable to complete Colonoscopy (intolerate prep), requests order for Cologuard. She reports being started on alendronate (Fosamax) 70mg  once weekly by her OB/GYN >12 months ago  01/20/18 OV: Ms. Monica Wyatt is here for CPE. She reports medication compliance, denies SE She has been trying to reduce CHO intake  She denies formal exercise, however remains active with house/yard work and FT position at [employer] She estimates to drink >50 oz water/day and continues to abstain from ETOH/tobacco She denies CP/dyspnea/dizziness/HA/dizziness/palpitations  Healthcare Maintenance: PAP-UTD, completed with OB/GYN Mammogram-UTD, completed with OB/GYN Immunizations-UTD Colorguard- Negative  Patient Care Team    Relationship Specialty Notifications Start End  Fortine, Berna Spare, NP PCP - General Family Medicine  06/08/16   [name], MD Consulting Physician Dermatology  06/08/16   Aloha Gell, MD Consulting Physician Obstetrics and Gynecology  06/08/16     Patient Active Problem List   Diagnosis Date Noted  . Healthcare maintenance 10/14/2017  . Insomnia 10/14/2017  .  Pre-diabetes 10/14/2017  . Screening for colon cancer 10/14/2017  . Osteoporosis 03/18/2017  . Hyperlipidemia 06/08/2016  . Chronic tension-type headache, not intractable 06/08/2016     Past Medical History:  Diagnosis Date  . Headache(784.0)   . Hyperlipidemia   . Medical history non-contributory   . Osteoporosis      Past Surgical History:  Procedure Laterality Date  . HERNIA REPAIR    . HYSTEROSCOPY W/D&C N/A 07/22/2012   Procedure: DILATATION AND CURETTAGE /HYSTEROSCOPY;  Surgeon: [name]. [name], MD;  Location: [facility];  Service: Gynecology;  Laterality: N/A;  . LAPAROSCOPY N/A 07/22/2012   Procedure: LAPAROSCOPY OPERATIVE;  Surgeon: [name] [name], MD;  Location: [facility];  Service: Gynecology;  Laterality: N/A;  . NO PAST SURGERIES    . SALPINGOOPHORECTOMY Bilateral 07/22/2012   Procedure: SALPINGO OOPHORECTOMY;  Surgeon: [name]. [name], MD;  Location: [facility];  Service: Gynecology;  Laterality: Bilateral;     Family History  Problem Relation Age of Onset  . Dementia Mother   . COPD Father   . Healthy Sister   . Alcohol abuse Brother   . Sleep apnea Son   . Healthy Sister      Social History   Substance and Sexual Activity  Drug Use No     Social History   Substance and Sexual Activity  Alcohol Use No     Social History   Tobacco Use  Smoking Status Never Smoker  Smokeless Tobacco Never Used     Outpatient  Encounter Medications as of 01/20/2018  Medication Sig  . alendronate (FOSAMAX) 70 MG tablet Take 1 tablet by mouth once a week.  [name] atorvastatin (LIPITOR) 20 MG tablet Take 1 tablet (20 mg total) by mouth daily.  . calcium elemental as carbonate (BARIATRIC TUMS ULTRA) 400 MG chewable tablet Chew 2.5 tablets by mouth as needed every 8 hours as needed for heartburn  . Cholecalciferol (VITAMIN D3) 2000 units TABS Take 1 tablet by mouth daily.  [name] ibuprofen (ADVIL) 200 MG tablet Take 3 tablets (600 mg total) by mouth every 6 (six) hours as  needed for pain.  . metroNIDAZOLE (METROGEL) 1 % gel    No facility-administered encounter medications on file as of 01/20/2018.     Allergies: Penicillins  Body mass index is 21.49 kg/m.  Blood pressure 112/66, pulse 63, height 5\' 3"  (1.6 m), weight 121 lb 4.8 oz (55 kg), SpO2 98 %.  Review of Systems  Constitutional: Positive for fatigue. Negative for activity change, appetite change, chills, diaphoresis, fever and unexpected weight change.  HENT: Negative for congestion.   Eyes: Negative for visual disturbance.  Respiratory: Negative for cough, chest tightness, shortness of breath, wheezing and stridor.   Cardiovascular: Negative for chest pain, palpitations and leg swelling.  Gastrointestinal: Negative for abdominal distention, anal bleeding, blood in stool, constipation, diarrhea, nausea and vomiting.  Endocrine: Negative for cold intolerance, heat intolerance, polydipsia, polyphagia and polyuria.  Genitourinary: Negative for difficulty urinating, flank pain and hematuria.  Musculoskeletal: Negative for arthralgias, back pain, gait problem, joint swelling, myalgias, neck pain and neck stiffness.  Skin: Negative for color change, pallor, rash and wound.  Neurological: Negative for dizziness and headaches.  Hematological: Does not bruise/bleed easily.  Psychiatric/Behavioral: Positive for sleep disturbance. Negative for behavioral problems, confusion, decreased concentration, dysphoric mood, hallucinations, self-injury and suicidal ideas. The patient is not nervous/anxious and is not hyperactive.       Objective:   Physical Exam  Constitutional: She is oriented to person, place, and time. She appears well-developed and well-nourished. No distress.  HENT:  Head: Normocephalic and atraumatic.  Right Ear: External ear normal. Tympanic membrane is not erythematous and not bulging. No decreased hearing is noted.  Left Ear: External ear normal. Tympanic membrane is not erythematous  and not bulging. No decreased hearing is noted.  Nose: Nose normal. No mucosal edema. Right sinus exhibits no maxillary sinus tenderness and no frontal sinus tenderness. Left sinus exhibits no maxillary sinus tenderness and no frontal sinus tenderness.  Mouth/Throat: Uvula is midline, oropharynx is clear and moist and mucous membranes are normal. No oropharyngeal exudate, posterior oropharyngeal edema, posterior oropharyngeal erythema or tonsillar abscesses. Tonsils are 0 on the right. Tonsils are 0 on the left. No tonsillar exudate.  Eyes: Pupils are equal, round, and reactive to light. Conjunctivae and EOM are normal.  Neck: Normal range of motion. Neck supple.  Cardiovascular: Normal rate, regular rhythm, normal heart sounds and intact distal pulses.  No murmur heard. Pulmonary/Chest: Effort normal and breath sounds normal. No stridor. No respiratory distress. She has no wheezes. She has no rales. She exhibits no tenderness.  Abdominal: Soft. Bowel sounds are normal. She exhibits no distension and no mass. There is no tenderness. There is no rebound and no guarding. No hernia.  Genitourinary:  Genitourinary Comments: Pelvic examination completed May 2019  Musculoskeletal: Normal range of motion. She exhibits no edema or tenderness.  Lymphadenopathy:    She has no cervical adenopathy.  Neurological: She is alert and oriented to  person, place, and time. Coordination normal.  Skin: Skin is warm and dry. Capillary refill takes less than 2 seconds. No rash noted. She is not diaphoretic. No erythema. No pallor.  Psychiatric: Her speech is normal and behavior is normal. Judgment and thought content normal. Her affect is blunt.  Blunt affect is her baseline      Assessment & Plan:   1. Pre-diabetes   2. Healthcare maintenance   3. Hyperlipidemia, unspecified hyperlipidemia type     Healthcare maintenance Continue all medications as directed. We will call you when lab results are  available. Continue to drink plenty of water and follow a Mediterranean diet. Continue to move as often as possible. Return in 6 months for regular follow-up and fasting labs.  Hyperlipidemia Will recheck lipids in 6 months  Pre-diabetes Lab Results  Component Value Date   HGBA1C 5.8 (H) 08/17/2017   HGBA1C 5.7 (H) 02/05/2017   HGBA1C 5.6 10/06/2016   She has been reducing CHO intake A1c re-checked today    FOLLOW-UP:  Return in about 6 months (around 07/21/2018) for Regular Follow Up.

## 2018-01-20 ENCOUNTER — Ambulatory Visit (INDEPENDENT_AMBULATORY_CARE_PROVIDER_SITE_OTHER): Payer: PRIVATE HEALTH INSURANCE | Admitting: Adult Health

## 2018-01-20 ENCOUNTER — Encounter: Payer: Self-pay | Admitting: Adult Health

## 2018-01-20 VITALS — BP 112/66 | HR 63 | Ht 63.0 in | Wt 121.3 lb

## 2018-01-20 DIAGNOSIS — E785 Hyperlipidemia, unspecified: Secondary | ICD-10-CM

## 2018-01-20 DIAGNOSIS — R7303 Prediabetes: Secondary | ICD-10-CM

## 2018-01-20 DIAGNOSIS — Z0000 Encounter for general adult medical examination without abnormal findings: Secondary | ICD-10-CM

## 2018-01-20 NOTE — Patient Instructions (Addendum)
Preventive Care for Adults, Female  A healthy lifestyle and preventive care can promote health and wellness. Preventive health guidelines for women include the following key practices.   A routine yearly physical is a good way to check with your health care provider about your health and preventive screening. It is a chance to share any concerns and updates on your health and to receive a thorough exam.   Visit your dentist for a routine exam and preventive care every 6 months. Brush your teeth twice a day and floss once a day. Good oral hygiene prevents tooth decay and gum disease.   The frequency of eye exams is based on your age, health, family medical history, use of contact lenses, and other factors. Follow your health care provider's recommendations for frequency of eye exams.   Eat a healthy diet. Foods like vegetables, fruits, whole grains, low-fat dairy products, and lean protein foods contain the nutrients you need without too many calories. Decrease your intake of foods high in solid fats, added sugars, and salt. Eat the right amount of calories for you.Get information about a proper diet from your health care provider, if necessary.   Regular physical exercise is one of the most important things you can do for your health. Most adults should get at least 150 minutes of moderate-intensity exercise (any activity that increases your heart rate and causes you to sweat) each week. In addition, most adults need muscle-strengthening exercises on 2 or more days a week.   Maintain a healthy weight. The body mass index (BMI) is a screening tool to identify possible weight problems. It provides an estimate of body fat based on height and weight. Your health care provider can find your BMI, and can help you achieve or maintain a healthy weight.For adults 20 years and older:   - A BMI below 18.5 is considered underweight.   - A BMI of 18.5 to 24.9 is normal.   - A BMI of 25 to 29.9 is  considered overweight.   - A BMI of 30 and above is considered obese.   Maintain normal blood lipids and cholesterol levels by exercising and minimizing your intake of trans and saturated fats.  Eat a balanced diet with plenty of fruit and vegetables. Blood tests for lipids and cholesterol should begin at age 20 and be repeated every 5 years minimum.  If your lipid or cholesterol levels are high, you are over 40, or you are at high risk for heart disease, you may need your cholesterol levels checked more frequently.Ongoing high lipid and cholesterol levels should be treated with medicines if diet and exercise are not working.   If you smoke, find out from your health care provider how to quit. If you do not use tobacco, do not start.   Lung cancer screening is recommended for adults aged 55-80 years who are at high risk for developing lung cancer because of a history of smoking. A yearly low-dose CT scan of the lungs is recommended for people who have at least a 30-pack-year history of smoking and are a current smoker or have quit within the past 15 years. A pack year of smoking is smoking an average of 1 pack of cigarettes a day for 1 year (for example: 1 pack a day for 30 years or 2 packs a day for 15 years). Yearly screening should continue until the smoker has stopped smoking for at least 15 years. Yearly screening should be stopped for people who develop a   health problem that would prevent them from having lung cancer treatment.   If you are pregnant, do not drink alcohol. If you are breastfeeding, be very cautious about drinking alcohol. If you are not pregnant and choose to drink alcohol, do not have more than 1 drink per day. One drink is considered to be 12 ounces (355 mL) of beer, 5 ounces (148 mL) of wine, or 1.5 ounces (44 mL) of liquor.   Avoid use of street drugs. Do not share needles with anyone. Ask for help if you need support or instructions about stopping the use of  drugs.   High blood pressure causes heart disease and increases the risk of stroke. Your blood pressure should be checked at least yearly.  Ongoing high blood pressure should be treated with medicines if weight loss and exercise do not work.   If you are 69-55 years old, ask your health care provider if you should take aspirin to prevent strokes.   Diabetes screening involves taking a blood sample to check your fasting blood sugar level. This should be done once every 3 years, after age 38, if you are within normal weight and without risk factors for diabetes. Testing should be considered at a younger age or be carried out more frequently if you are overweight and have at least 1 risk factor for diabetes.   Breast cancer screening is essential preventive care for women. You should practice "breast self-awareness."  This means understanding the normal appearance and feel of your breasts and may include breast self-examination.  Any changes detected, no matter how small, should be reported to a health care provider.  Women in their 80s and 30s should have a clinical breast exam (CBE) by a health care provider as part of a regular health exam every 1 to 3 years.  After age 66, women should have a CBE every year.  Starting at age 1, women should consider having a mammogram (breast X-ray test) every year.  Women who have a family history of breast cancer should talk to their health care provider about genetic screening.  Women at a high risk of breast cancer should talk to their health care providers about having an MRI and a mammogram every year.   -Breast cancer gene (BRCA)-related cancer risk assessment is recommended for women who have family members with BRCA-related cancers. BRCA-related cancers include breast, ovarian, tubal, and peritoneal cancers. Having family members with these cancers may be associated with an increased risk for harmful changes (mutations) in the breast cancer genes BRCA1 and  BRCA2. Results of the assessment will determine the need for genetic counseling and BRCA1 and BRCA2 testing.   The Pap test is a screening test for cervical cancer. A Pap test can show cell changes on the cervix that might become cervical cancer if left untreated. A Pap test is a procedure in which cells are obtained and examined from the lower end of the uterus (cervix).   - Women should have a Pap test starting at age 57.   - Between ages 90 and 70, Pap tests should be repeated every 2 years.   - Beginning at age 63, you should have a Pap test every 3 years as long as the past 3 Pap tests have been normal.   - Some women have medical problems that increase the chance of getting cervical cancer. Talk to your health care provider about these problems. It is especially important to talk to your health care provider if a  new problem develops soon after your last Pap test. In these cases, your health care provider may recommend more frequent screening and Pap tests.   - The above recommendations are the same for women who have or have not gotten the vaccine for human papillomavirus (HPV).   - If you had a hysterectomy for a problem that was not cancer or a condition that could lead to cancer, then you no longer need Pap tests. Even if you no longer need a Pap test, a regular exam is a good idea to make sure no other problems are starting.   - If you are between ages 36 and 66 years, and you have had normal Pap tests going back 10 years, you no longer need Pap tests. Even if you no longer need a Pap test, a regular exam is a good idea to make sure no other problems are starting.   - If you have had past treatment for cervical cancer or a condition that could lead to cancer, you need Pap tests and screening for cancer for at least 20 years after your treatment.   - If Pap tests have been discontinued, risk factors (such as a new sexual partner) need to be reassessed to determine if screening should  be resumed.   - The HPV test is an additional test that may be used for cervical cancer screening. The HPV test looks for the virus that can cause the cell changes on the cervix. The cells collected during the Pap test can be tested for HPV. The HPV test could be used to screen women aged 70 years and older, and should be used in women of any age who have unclear Pap test results. After the age of 67, women should have HPV testing at the same frequency as a Pap test.   Colorectal cancer can be detected and often prevented. Most routine colorectal cancer screening begins at the age of 57 years and continues through age 26 years. However, your health care provider may recommend screening at an earlier age if you have risk factors for colon cancer. On a yearly basis, your health care provider may provide home test kits to check for hidden blood in the stool.  Use of a small camera at the end of a tube, to directly examine the colon (sigmoidoscopy or colonoscopy), can detect the earliest forms of colorectal cancer. Talk to your health care provider about this at age 23, when routine screening begins. Direct exam of the colon should be repeated every 5 -10 years through age 49 years, unless early forms of pre-cancerous polyps or small growths are found.   People who are at an increased risk for hepatitis B should be screened for this virus. You are considered at high risk for hepatitis B if:  -You were born in a country where hepatitis B occurs often. Talk with your health care provider about which countries are considered high risk.  - Your parents were born in a high-risk country and you have not received a shot to protect against hepatitis B (hepatitis B vaccine).  - You have HIV or AIDS.  - You use needles to inject street drugs.  - You live with, or have sex with, someone who has Hepatitis B.  - You get hemodialysis treatment.  - You take certain medicines for conditions like cancer, organ  transplantation, and autoimmune conditions.   Hepatitis C blood testing is recommended for all people born from [birth year] through 1965 and any individual  with known risks for hepatitis C.   Practice safe sex. Use condoms and avoid high-risk sexual practices to reduce the spread of sexually transmitted infections (STIs). STIs include gonorrhea, chlamydia, syphilis, trichomonas, herpes, HPV, and human immunodeficiency virus (HIV). Herpes, HIV, and HPV are viral illnesses that have no cure. They can result in disability, cancer, and death. Sexually active women aged 25 years and younger should be checked for chlamydia. Older women with new or multiple partners should also be tested for chlamydia. Testing for other STIs is recommended if you are sexually active and at increased risk.   Osteoporosis is a disease in which the bones lose minerals and strength with aging. This can result in serious bone fractures or breaks. The risk of osteoporosis can be identified using a bone density scan. Women ages 65 years and over and women at risk for fractures or osteoporosis should discuss screening with their health care providers. Ask your health care provider whether you should take a calcium supplement or vitamin D to There are also several preventive steps women can take to avoid osteoporosis and resulting fractures or to keep osteoporosis from worsening. -->Recommendations include:  Eat a balanced diet high in fruits, vegetables, calcium, and vitamins.  Get enough calcium. The recommended total intake of is 1,200 mg daily; for best absorption, if taking supplements, divide doses into 250-500 mg doses throughout the day. Of the two types of calcium, calcium carbonate is best absorbed when taken with food but calcium citrate can be taken on an empty stomach.  Get enough vitamin D. NAMS and the National Osteoporosis Foundation recommend at least 1,000 IU per day for women age 50 and over who are at risk of vitamin D  deficiency. Vitamin D deficiency can be caused by inadequate sun exposure (for example, those who live in northern latitudes).  Avoid alcohol and smoking. Heavy alcohol intake (more than 7 drinks per week) increases the risk of falls and hip fracture and women smokers tend to lose bone more rapidly and have lower bone mass than nonsmokers. Stopping smoking is one of the most important changes women can make to improve their health and decrease risk for disease.  Be physically active every day. Weight-bearing exercise (for example, fast walking, hiking, jogging, and weight training) may strengthen bones or slow the rate of bone loss that comes with aging. Balancing and muscle-strengthening exercises can reduce the risk of falling and fracture.  Consider therapeutic medications. Currently, several types of effective drugs are available. Healthcare providers can recommend the type most appropriate for each woman.  Eliminate environmental factors that may contribute to accidents. Falls cause nearly 90% of all osteoporotic fractures, so reducing this risk is an important bone-health strategy. Measures include ample lighting, removing obstructions to walking, using nonskid rugs on floors, and placing mats and/or grab bars in showers.  Be aware of medication side effects. Some common medicines make bones weaker. These include a type of steroid drug called glucocorticoids used for arthritis and asthma, some antiseizure drugs, certain sleeping pills, treatments for endometriosis, and some cancer drugs. An overactive thyroid gland or using too much thyroid hormone for an underactive thyroid can also be a problem. If you are taking these medicines, talk to your doctor about what you can do to help protect your bones.reduce the rate of osteoporosis.    Menopause can be associated with physical symptoms and risks. Hormone replacement therapy is available to decrease symptoms and risks. You should talk to your  health care provider   about whether hormone replacement therapy is right for you.   Use sunscreen. Apply sunscreen liberally and repeatedly throughout the day. You should seek shade when your shadow is shorter than you. Protect yourself by wearing long sleeves, pants, a wide-brimmed hat, and sunglasses year round, whenever you are outdoors.   Once a month, do a whole body skin exam, using a mirror to look at the skin on your back. Tell your health care provider of new moles, moles that have irregular borders, moles that are larger than a pencil eraser, or moles that have changed in shape or color.   -Stay current with required vaccines (immunizations).   Influenza vaccine. All adults should be immunized every year.  Tetanus, diphtheria, and acellular pertussis (Td, Tdap) vaccine. Pregnant women should receive 1 dose of Tdap vaccine during each pregnancy. The dose should be obtained regardless of the length of time since the last dose. Immunization is preferred during the 27th 36th week of gestation. An adult who has not previously received Tdap or who does not know her vaccine status should receive 1 dose of Tdap. This initial dose should be followed by tetanus and diphtheria toxoids (Td) booster doses every 10 years. Adults with an unknown or incomplete history of completing a 3-dose immunization series with Td-containing vaccines should begin or complete a primary immunization series including a Tdap dose. Adults should receive a Td booster every 10 years.  Varicella vaccine. An adult without evidence of immunity to varicella should receive 2 doses or a second dose if she has previously received 1 dose. Pregnant females who do not have evidence of immunity should receive the first dose after pregnancy. This first dose should be obtained before leaving the health care facility. The second dose should be obtained 4 8 weeks after the first dose.  Human papillomavirus (HPV) vaccine. Females aged 13 26  years who have not received the vaccine previously should obtain the 3-dose series. The vaccine is not recommended for use in pregnant females. However, pregnancy testing is not needed before receiving a dose. If a female is found to be pregnant after receiving a dose, no treatment is needed. In that case, the remaining doses should be delayed until after the pregnancy. Immunization is recommended for any person with an immunocompromised condition through the age of 26 years if she did not get any or all doses earlier. During the 3-dose series, the second dose should be obtained 4 8 weeks after the first dose. The third dose should be obtained 24 weeks after the first dose and 16 weeks after the second dose.  Zoster vaccine. One dose is recommended for adults aged 60 years or older unless certain conditions are present.  Measles, mumps, and rubella (MMR) vaccine. Adults born before 1957 generally are considered immune to measles and mumps. Adults born in 1957 or later should have 1 or more doses of MMR vaccine unless there is a contraindication to the vaccine or there is laboratory evidence of immunity to each of the three diseases. A routine second dose of MMR vaccine should be obtained at least 28 days after the first dose for students attending postsecondary schools, health care workers, or international travelers. People who received inactivated measles vaccine or an unknown type of measles vaccine during 1963 1967 should receive 2 doses of MMR vaccine. People who received inactivated mumps vaccine or an unknown type of mumps vaccine before 1979 and are at high risk for mumps infection should consider immunization with 2 doses of   MMR vaccine. For females of childbearing age, rubella immunity should be determined. If there is no evidence of immunity, females who are not pregnant should be vaccinated. If there is no evidence of immunity, females who are pregnant should delay immunization until after pregnancy.  Unvaccinated health care workers born before [birth year] who lack laboratory evidence of measles, mumps, or rubella immunity or laboratory confirmation of disease should consider measles and mumps immunization with 2 doses of MMR vaccine or rubella immunization with 1 dose of MMR vaccine.  Pneumococcal 13-valent conjugate (PCV13) vaccine. When indicated, a person who is uncertain of her immunization history and has no record of immunization should receive the PCV13 vaccine. An adult aged 54 years or older who has certain medical conditions and has not been previously immunized should receive 1 dose of PCV13 vaccine. This PCV13 should be followed with a dose of pneumococcal polysaccharide (PPSV23) vaccine. The PPSV23 vaccine dose should be obtained at least 8 weeks after the dose of PCV13 vaccine. An adult aged 58 years or older who has certain medical conditions and previously received 1 or more doses of PPSV23 vaccine should receive 1 dose of PCV13. The PCV13 vaccine dose should be obtained 1 or more years after the last PPSV23 vaccine dose.  Pneumococcal polysaccharide (PPSV23) vaccine. When PCV13 is also indicated, PCV13 should be obtained first. All adults aged 58 years and older should be immunized. An adult younger than age 65 years who has certain medical conditions should be immunized. Any person who resides in a nursing home or long-term care facility should be immunized. An adult smoker should be immunized. People with an immunocompromised condition and certain other conditions should receive both PCV13 and PPSV23 vaccines. People with human immunodeficiency virus (HIV) infection should be immunized as soon as possible after diagnosis. Immunization during chemotherapy or radiation therapy should be avoided. Routine use of PPSV23 vaccine is not recommended for American Indians, [place] Natives, or people younger than 65 years unless there are medical conditions that require PPSV23 vaccine. When indicated,  people who have unknown immunization and have no record of immunization should receive PPSV23 vaccine. One-time revaccination 5 years after the first dose of PPSV23 is recommended for people aged 70 64 years who have chronic kidney failure, nephrotic syndrome, asplenia, or immunocompromised conditions. People who received 1 2 doses of PPSV23 before age 32 years should receive another dose of PPSV23 vaccine at age 96 years or later if at least 5 years have passed since the previous dose. Doses of PPSV23 are not needed for people immunized with PPSV23 at or after age 55 years.  Meningococcal vaccine. Adults with asplenia or persistent complement component deficiencies should receive 2 doses of quadrivalent meningococcal conjugate (MenACWY-D) vaccine. The doses should be obtained at least 2 months apart. Microbiologists working with certain meningococcal bacteria, [employer] recruits, people at risk during an outbreak, and people who travel to or live in countries with a high rate of meningitis should be immunized. A first-year college student up through age 58 years who is living in a residence hall should receive a dose if she did not receive a dose on or after her 16th birthday. Adults who have certain high-risk conditions should receive one or more doses of vaccine.  Hepatitis A vaccine. Adults who wish to be protected from this disease, have certain high-risk conditions, work with hepatitis A-infected animals, work in hepatitis A research labs, or travel to or work in countries with a high rate of hepatitis A should be  immunized. Adults who were previously unvaccinated and who anticipate close contact with an international adoptee during the first 60 days after arrival in the [place] from a country with a high rate of hepatitis A should be immunized.  Hepatitis B vaccine.  Adults who wish to be protected from this disease, have certain high-risk conditions, may be exposed to blood or other infectious  body fluids, are household contacts or sex partners of hepatitis B positive people, are clients or workers in certain care facilities, or travel to or work in countries with a high rate of hepatitis B should be immunized.  Haemophilus influenzae type b (Hib) vaccine. A previously unvaccinated person with asplenia or sickle cell disease or having a scheduled splenectomy should receive 1 dose of Hib vaccine. Regardless of previous immunization, a recipient of a hematopoietic stem cell transplant should receive a 3-dose series 6 12 months after her successful transplant. Hib vaccine is not recommended for adults with HIV infection.  Preventive Services / Frequency Ages 6 to 39years  Blood pressure check.** / Every 1 to 2 years.  Lipid and cholesterol check.** / Every 5 years beginning at age 39.  Clinical breast exam.** / Every 3 years for women in their 61s and 62s.  BRCA-related cancer risk assessment.** / For women who have family members with a BRCA-related cancer (breast, ovarian, tubal, or peritoneal cancers).  Pap test.** / Every 2 years from ages 47 through 85. Every 3 years starting at age 34 through age 12 or 74 with a history of 3 consecutive normal Pap tests.  HPV screening.** / Every 3 years from ages 46 through ages 43 to 54 with a history of 3 consecutive normal Pap tests.  Hepatitis C blood test.** / For any individual with known risks for hepatitis C.  Skin self-exam. / Monthly.  Influenza vaccine. / Every year.  Tetanus, diphtheria, and acellular pertussis (Tdap, Td) vaccine.** / Consult your health care provider. Pregnant women should receive 1 dose of Tdap vaccine during each pregnancy. 1 dose of Td every 10 years.  Varicella vaccine.** / Consult your health care provider. Pregnant females who do not have evidence of immunity should receive the first dose after pregnancy.  HPV vaccine. / 3 doses over 6 months, if 64 and younger. The vaccine is not recommended for use in  pregnant females. However, pregnancy testing is not needed before receiving a dose.  Measles, mumps, rubella (MMR) vaccine.** / You need at least 1 dose of MMR if you were born in 1957 or later. You may also need a 2nd dose. For females of childbearing age, rubella immunity should be determined. If there is no evidence of immunity, females who are not pregnant should be vaccinated. If there is no evidence of immunity, females who are pregnant should delay immunization until after pregnancy.  Pneumococcal 13-valent conjugate (PCV13) vaccine.** / Consult your health care provider.  Pneumococcal polysaccharide (PPSV23) vaccine.** / 1 to 2 doses if you smoke cigarettes or if you have certain conditions.  Meningococcal vaccine.** / 1 dose if you are age 71 to 37 years and a Market researcher living in a residence hall, or have one of several medical conditions, you need to get vaccinated against meningococcal disease. You may also need additional booster doses.  Hepatitis A vaccine.** / Consult your health care provider.  Hepatitis B vaccine.** / Consult your health care provider.  Haemophilus influenzae type b (Hib) vaccine.** / Consult your health care provider.  Ages 55 to 64years  Blood pressure check.** / Every 1 to 2 years.  Lipid and cholesterol check.** / Every 5 years beginning at age 20 years.  Lung cancer screening. / Every year if you are aged 55 80 years and have a 30-pack-year history of smoking and currently smoke or have quit within the past 15 years. Yearly screening is stopped once you have quit smoking for at least 15 years or develop a health problem that would prevent you from having lung cancer treatment.  Clinical breast exam.** / Every year after age 40 years.  BRCA-related cancer risk assessment.** / For women who have family members with a BRCA-related cancer (breast, ovarian, tubal, or peritoneal cancers).  Mammogram.** / Every year beginning at age 40  years and continuing for as long as you are in good health. Consult with your health care provider.  Pap test.** / Every 3 years starting at age 30 years through age 65 or 70 years with a history of 3 consecutive normal Pap tests.  HPV screening.** / Every 3 years from ages 30 years through ages 65 to 70 years with a history of 3 consecutive normal Pap tests.  Fecal occult blood test (FOBT) of stool. / Every year beginning at age 50 years and continuing until age 75 years. You may not need to do this test if you get a colonoscopy every 10 years.  Flexible sigmoidoscopy or colonoscopy.** / Every 5 years for a flexible sigmoidoscopy or every 10 years for a colonoscopy beginning at age 50 years and continuing until age 75 years.  Hepatitis C blood test.** / For all people born from 1945 through 1965 and any individual with known risks for hepatitis C.  Skin self-exam. / Monthly.  Influenza vaccine. / Every year.  Tetanus, diphtheria, and acellular pertussis (Tdap/Td) vaccine.** / Consult your health care provider. Pregnant women should receive 1 dose of Tdap vaccine during each pregnancy. 1 dose of Td every 10 years.  Varicella vaccine.** / Consult your health care provider. Pregnant females who do not have evidence of immunity should receive the first dose after pregnancy.  Zoster vaccine.** / 1 dose for adults aged 60 years or older.  Measles, mumps, rubella (MMR) vaccine.** / You need at least 1 dose of MMR if you were born in 1957 or later. You may also need a 2nd dose. For females of childbearing age, rubella immunity should be determined. If there is no evidence of immunity, females who are not pregnant should be vaccinated. If there is no evidence of immunity, females who are pregnant should delay immunization until after pregnancy.  Pneumococcal 13-valent conjugate (PCV13) vaccine.** / Consult your health care provider.  Pneumococcal polysaccharide (PPSV23) vaccine.** / 1 to 2 doses if  you smoke cigarettes or if you have certain conditions.  Meningococcal vaccine.** / Consult your health care provider.  Hepatitis A vaccine.** / Consult your health care provider.  Hepatitis B vaccine.** / Consult your health care provider.  Haemophilus influenzae type b (Hib) vaccine.** / Consult your health care provider.  Ages 65 years and over  Blood pressure check.** / Every 1 to 2 years.  Lipid and cholesterol check.** / Every 5 years beginning at age 20 years.  Lung cancer screening. / Every year if you are aged 55 80 years and have a 30-pack-year history of smoking and currently smoke or have quit within the past 15 years. Yearly screening is stopped once you have quit smoking for at least 15 years or develop a health problem that   would prevent you from having lung cancer treatment.  Clinical breast exam.** / Every year after age 103 years.  BRCA-related cancer risk assessment.** / For women who have family members with a BRCA-related cancer (breast, ovarian, tubal, or peritoneal cancers).  Mammogram.** / Every year beginning at age 36 years and continuing for as long as you are in good health. Consult with your health care provider.  Pap test.** / Every 3 years starting at age 5 years through age 85 or 10 years with 3 consecutive normal Pap tests. Testing can be stopped between 65 and 70 years with 3 consecutive normal Pap tests and no abnormal Pap or HPV tests in the past 10 years.  HPV screening.** / Every 3 years from ages 93 years through ages 70 or 45 years with a history of 3 consecutive normal Pap tests. Testing can be stopped between 65 and 70 years with 3 consecutive normal Pap tests and no abnormal Pap or HPV tests in the past 10 years.  Fecal occult blood test (FOBT) of stool. / Every year beginning at age 8 years and continuing until age 45 years. You may not need to do this test if you get a colonoscopy every 10 years.  Flexible sigmoidoscopy or colonoscopy.** /  Every 5 years for a flexible sigmoidoscopy or every 10 years for a colonoscopy beginning at age 69 years and continuing until age 68 years.  Hepatitis C blood test.** / For all people born from [birth year] through 1965 and any individual with known risks for hepatitis C.  Osteoporosis screening.** / A one-time screening for women ages 7 years and over and women at risk for fractures or osteoporosis.  Skin self-exam. / Monthly.  Influenza vaccine. / Every year.  Tetanus, diphtheria, and acellular pertussis (Tdap/Td) vaccine.** / 1 dose of Td every 10 years.  Varicella vaccine.** / Consult your health care provider.  Zoster vaccine.** / 1 dose for adults aged 5 years or older.  Pneumococcal 13-valent conjugate (PCV13) vaccine.** / Consult your health care provider.  Pneumococcal polysaccharide (PPSV23) vaccine.** / 1 dose for all adults aged 74 years and older.  Meningococcal vaccine.** / Consult your health care provider.  Hepatitis A vaccine.** / Consult your health care provider.  Hepatitis B vaccine.** / Consult your health care provider.  Haemophilus influenzae type b (Hib) vaccine.** / Consult your health care provider. ** Family history and personal history of risk and conditions may change your health care provider's recommendations. Document Released: 06/23/2001 Document Revised: 02/15/2013  [facility] Patient Information 2014 [place], [place].   EXERCISE AND DIET:  We recommended that you start or continue a regular exercise program for good health. Regular exercise means any activity that makes your heart beat faster and makes you sweat.  We recommend exercising at least 30 minutes per day at least 3 days a week, preferably 5.  We also recommend a diet low in fat and sugar / carbohydrates.  Inactivity, poor dietary choices and obesity can cause diabetes, heart attack, stroke, and kidney damage, among others.     ALCOHOL AND SMOKING:  Women should limit their alcohol intake to no  more than 7 drinks/beers/glasses of wine (combined, not each!) per week. Moderation of alcohol intake to this level decreases your risk of breast cancer and liver damage.  ( And of course, no recreational drugs are part of a healthy lifestyle.)  Also, you should not be smoking at all or even being exposed to second hand smoke. Most people know smoking can  cause cancer, and various heart and lung diseases, but did you know it also contributes to weakening of your bones?  Aging of your skin?  Yellowing of your teeth and nails?   CALCIUM AND VITAMIN D:  Adequate intake of calcium and Vitamin D are recommended.  The recommendations for exact amounts of these supplements seem to change often, but generally speaking 600 mg of calcium (either carbonate or citrate) and 800 units of Vitamin D per day seems prudent. Certain women may benefit from higher intake of Vitamin D.  If you are among these women, your doctor will have told you during your visit.     PAP SMEARS:  Pap smears, to check for cervical cancer or precancers,  have traditionally been done yearly, although recent scientific advances have shown that most women can have pap smears less often.  However, every woman still should have a physical exam from her gynecologist or primary care physician every year. It will include a breast check, inspection of the vulva and vagina to check for abnormal growths or skin changes, a visual exam of the cervix, and then an exam to evaluate the size and shape of the uterus and ovaries.  And after 62 years of age, a rectal exam is indicated to check for rectal cancers. We will also provide age appropriate advice regarding health maintenance, like when you should have certain vaccines, screening for sexually transmitted diseases, bone density testing, colonoscopy, mammograms, etc.    MAMMOGRAMS:  All women over 65 years old should have a yearly mammogram. Many facilities now offer a "3D" mammogram, which may cost  around $50 extra out of pocket. If possible,  we recommend you accept the option to have the 3D mammogram performed.  It both reduces the number of women who will be called back for extra views which then turn out to be normal, and it is better than the routine mammogram at detecting truly abnormal areas.     COLONOSCOPY:  Colonoscopy to screen for colon cancer is recommended for all women at age 26.  We know, you hate the idea of the prep.  We agree, BUT, having colon cancer and not knowing it is worse!!  Colon cancer so often starts as a polyp that can be seen and removed at colonscopy, which can quite literally save your life!  And if your first colonoscopy is normal and you have no family history of colon cancer, most women don't have to have it again for 10 years.  Once every ten years, you can do something that may end up saving your life, right?  We will be happy to help you get it scheduled when you are ready.  Be sure to check your insurance coverage so you understand how much it will cost.  It may be covered as a preventative service at no cost, but you should check your particular policy.    Mediterranean Diet A Mediterranean diet refers to food and lifestyle choices that are based on the traditions of countries located on the [employer]. This way of eating has been shown to help prevent certain conditions and improve outcomes for people who have chronic diseases, like kidney disease and heart disease. What are tips for following this plan? Lifestyle  Cook and eat meals together with your family, when possible.  Drink enough fluid to keep your urine clear or pale yellow.  Be physically active every day. This includes: ? Aerobic exercise like running or swimming. ? Leisure activities like  gardening, walking, or housework.  Get 7-8 hours of sleep each night.  If recommended by your health care provider, drink red wine in moderation. This means 1 glass a day for nonpregnant women  and 2 glasses a day for men. A glass of wine equals 5 oz (150 mL). Reading food labels  Check the serving size of packaged foods. For foods such as rice and pasta, the serving size refers to the amount of cooked product, not dry.  Check the total fat in packaged foods. Avoid foods that have saturated fat or trans fats.  Check the ingredients list for added sugars, such as corn syrup. Shopping  At the grocery store, buy most of your food from the areas near the walls of the store. This includes: ? Fresh fruits and vegetables (produce). ? Grains, beans, nuts, and seeds. Some of these may be available in unpackaged forms or large amounts (in bulk). ? Fresh seafood. ? Poultry and eggs. ? Low-fat dairy products.  Buy whole ingredients instead of prepackaged foods.  Buy fresh fruits and vegetables in-season from local farmers markets.  Buy frozen fruits and vegetables in resealable bags.  If you do not have access to quality fresh seafood, buy precooked frozen shrimp or canned fish, such as tuna, salmon, or sardines.  Buy small amounts of raw or cooked vegetables, salads, or olives from the deli or salad bar at your store.  Stock your pantry so you always have certain foods on hand, such as olive oil, canned tuna, canned tomatoes, rice, pasta, and beans. Cooking  Cook foods with extra-virgin olive oil instead of using butter or other vegetable oils.  Have meat as a side dish, and have vegetables or grains as your main dish. This means having meat in small portions or adding small amounts of meat to foods like pasta or stew.  Use beans or vegetables instead of meat in common dishes like chili or lasagna.  Experiment with different cooking methods. Try roasting or broiling vegetables instead of steaming or sauteing them.  Add frozen vegetables to soups, stews, pasta, or rice.  Add nuts or seeds for added healthy fat at each meal. You can add these to yogurt, salads, or vegetable  dishes.  Marinate fish or vegetables using olive oil, lemon juice, garlic, and fresh herbs. Meal planning  Plan to eat 1 vegetarian meal one day each week. Try to work up to 2 vegetarian meals, if possible.  Eat seafood 2 or more times a week.  Have healthy snacks readily available, such as: ? Vegetable sticks with hummus. ? [place] yogurt. ? Fruit and nut trail mix.  Eat balanced meals throughout the week. This includes: ? Fruit: 2-3 servings a day ? Vegetables: 4-5 servings a day ? Low-fat dairy: 2 servings a day ? Fish, poultry, or lean meat: 1 serving a day ? Beans and legumes: 2 or more servings a week ? Nuts and seeds: 1-2 servings a day ? Whole grains: 6-8 servings a day ? Extra-virgin olive oil: 3-4 servings a day  Limit red meat and sweets to only a few servings a month What are my food choices?  Mediterranean diet ? Recommended ? Grains: Whole-grain pasta. Brown rice. Bulgar wheat. Polenta. Couscous. Whole-wheat bread. [place]. ? Vegetables: Artichokes. Beets. Broccoli. Cabbage. Carrots. Eggplant. Green beans. Chard. Kale. Spinach. Onions. Leeks. Peas. Squash. Tomatoes. Peppers. Radishes. ? Fruits: Apples. Apricots. Avocado. Berries. Bananas. Cherries. Dates. Figs. Grapes. Lemons. Melon. Oranges. Peaches. Plums. Pomegranate. ? Meats and other protein  foods: Beans. Almonds. Sunflower seeds. Pine nuts. Peanuts. [place]. Salmon. Scallops. Shrimp. [place]. Tilapia. Clams. Oysters. Eggs. ? Dairy: Low-fat milk. Cheese. Greek yogurt. ? Beverages: Water. Red wine. Herbal tea. ? Fats and oils: Extra virgin olive oil. Avocado oil. Grape seed oil. ? Sweets and desserts: [place] yogurt with honey. Baked apples. Poached pears. Trail mix. ? Seasoning and other foods: Basil. Cilantro. Coriander. Cumin. Mint. Parsley. Sage. Rosemary. Tarragon. Garlic. Oregano. Thyme. Pepper. Balsalmic vinegar. Tahini. Hummus. Tomato sauce. Olives. Mushrooms. ? Limit these ? Grains: Prepackaged pasta or  rice dishes. Prepackaged cereal with added sugar. ? Vegetables: Deep fried potatoes (french fries). ? Fruits: Fruit canned in syrup. ? Meats and other protein foods: Beef. Pork. Lamb. Poultry with skin. Hot dogs. [place] Salines. ? Dairy: Ice cream. Sour cream. Whole milk. ? Beverages: Juice. Sugar-sweetened soft drinks. Beer. Liquor and spirits. ? Fats and oils: Butter. Canola oil. Vegetable oil. Beef fat (tallow). Lard. ? Sweets and desserts: Cookies. Cakes. Pies. Candy. ? Seasoning and other foods: Mayonnaise. Premade sauces and marinades. ? The items listed may not be a complete list. Talk with your dietitian about what dietary choices are right for you. Summary  The Mediterranean diet includes both food and lifestyle choices.  Eat a variety of fresh fruits and vegetables, beans, nuts, seeds, and whole grains.  Limit the amount of red meat and sweets that you eat.  Talk with your health care provider about whether it is safe for you to drink red wine in moderation. This means 1 glass a day for nonpregnant women and 2 glasses a day for men. A glass of wine equals 5 oz (150 mL). This information is not intended to replace advice given to you by your health care provider. Make sure you discuss any questions you have with your health care provider. Document Released: 12/19/2015 Document Revised: 01/21/2016 Document Reviewed: 12/19/2015 Elsevier Interactive Patient Education  2018 [place] all medications as directed. We will call you when lab results are available. Continue to drink plenty of water and follow a Mediterranean diet. Continue to move as often as possible. Return in 6 months for regular follow-up and fasting labs. NICE TO SEE YOU!

## 2018-01-20 NOTE — Assessment & Plan Note (Signed)
Lab Results  Component Value Date   HGBA1C 5.8 (H) 08/17/2017   HGBA1C 5.7 (H) 02/05/2017   HGBA1C 5.6 10/06/2016   She has been reducing CHO intake A1c re-checked today

## 2018-01-20 NOTE — Assessment & Plan Note (Signed)
Will recheck lipids in 6 months

## 2018-01-20 NOTE — Assessment & Plan Note (Signed)
Continue all medications as directed. We will call you when lab results are available. Continue to drink plenty of water and follow a Mediterranean diet. Continue to move as often as possible. Return in 6 months for regular follow-up and fasting labs.

## 2018-01-21 LAB — COMPREHENSIVE METABOLIC PANEL
A/G RATIO: 2.3 — AB (ref 1.2–2.2)
ALT: 18 IU/L (ref 0–32)
AST: 20 IU/L (ref 0–40)
Albumin: 4.6 g/dL (ref 3.6–4.8)
Alkaline Phosphatase: 65 IU/L (ref 39–117)
BILIRUBIN TOTAL: 0.3 mg/dL (ref 0.0–1.2)
BUN/Creatinine Ratio: 20 (ref 12–28)
BUN: 20 mg/dL (ref 8–27)
CHLORIDE: 101 mmol/L (ref 96–106)
CO2: 28 mmol/L (ref 20–29)
Calcium: 10 mg/dL (ref 8.7–10.3)
Creatinine, Ser: 1.01 mg/dL — ABNORMAL HIGH (ref 0.57–1.00)
GFR calc Af Amer: 69 mL/min/{1.73_m2} (ref >59)
GFR calc non Af Amer: 60 mL/min/{1.73_m2} (ref >59)
GLUCOSE: 65 mg/dL (ref 65–99)
Globulin, Total: 2 g/dL (ref 1.5–4.5)
POTASSIUM: 5.5 mmol/L — AB (ref 3.5–5.2)
SODIUM: 140 mmol/L (ref 134–144)
Total Protein: 6.6 g/dL (ref 6.0–8.5)

## 2018-01-21 LAB — CBC WITH DIFFERENTIAL/PLATELET
BASOS ABS: 0 10*3/uL (ref 0.0–0.2)
BASOS: 1 %
EOS (ABSOLUTE): 0.1 10*3/uL (ref 0.0–0.4)
Eos: 1 %
Hematocrit: 38.6 % (ref 34.0–46.6)
Hemoglobin: 13 g/dL (ref 11.1–15.9)
Immature Grans (Abs): 0 10*3/uL (ref 0.0–0.1)
Immature Granulocytes: 0 %
Lymphocytes Absolute: 1.9 10*3/uL (ref 0.7–3.1)
Lymphs: 35 %
MCH: 31.1 pg (ref 26.6–33.0)
MCHC: 33.7 g/dL (ref 31.5–35.7)
MCV: 92 fL (ref 79–97)
MONOS ABS: 0.6 10*3/uL (ref 0.1–0.9)
Monocytes: 11 %
NEUTROS PCT: 52 %
Neutrophils Absolute: 2.8 10*3/uL (ref 1.4–7.0)
PLATELETS: 213 10*3/uL (ref 150–450)
RBC: 4.18 x10E6/uL (ref 3.77–5.28)
RDW: 12.3 % (ref 12.3–15.4)
WBC: 5.5 10*3/uL (ref 3.4–10.8)

## 2018-01-21 LAB — HEMOGLOBIN A1C
Est. average glucose Bld gHb Est-mCnc: 120 mg/dL
Hgb A1c MFr Bld: 5.8 % — ABNORMAL HIGH (ref 4.8–5.6)

## 2018-01-24 ENCOUNTER — Encounter: Payer: Self-pay | Admitting: Adult Health

## 2018-03-17 ENCOUNTER — Other Ambulatory Visit: Payer: PRIVATE HEALTH INSURANCE

## 2018-04-11 ENCOUNTER — Other Ambulatory Visit: Payer: PRIVATE HEALTH INSURANCE

## 2018-04-19 ENCOUNTER — Ambulatory Visit: Payer: Self-pay | Admitting: Registered Nurse

## 2018-04-19 ENCOUNTER — Encounter: Payer: Self-pay | Admitting: Registered Nurse

## 2018-04-19 VITALS — BP 114/73 | HR 69 | Temp 98.4°F

## 2018-04-19 DIAGNOSIS — J069 Acute upper respiratory infection, unspecified: Secondary | ICD-10-CM

## 2018-04-19 DIAGNOSIS — H6502 Acute serous otitis media, left ear: Secondary | ICD-10-CM

## 2018-04-19 DIAGNOSIS — B9789 Other viral agents as the cause of diseases classified elsewhere: Principal | ICD-10-CM

## 2018-04-19 MED ORDER — AZITHROMYCIN 250 MG PO TABS: ORAL_TABLET | ORAL | 0 refills | Status: DC

## 2018-04-19 MED ORDER — HYDROCODONE-HOMATROPINE 5-1.5 MG/5ML PO SYRP: 5.0000 mL | ORAL_SOLUTION | ORAL | 0 refills | Status: AC | PRN

## 2018-04-19 MED ORDER — SALINE SPRAY 0.65 % NA SOLN: 2.0000 | NASAL | 0 refills | Status: DC

## 2018-04-19 NOTE — Patient Instructions (Addendum)
Viral Respiratory Infection A respiratory infection is an illness that affects part of the respiratory system, such as the lungs, nose, or throat. Most respiratory infections are caused by either viruses or bacteria. A respiratory infection that is caused by a virus is called a viral respiratory infection. Common types of viral respiratory infections include:  A cold.  The flu (influenza).  A respiratory syncytial virus (RSV) infection.  How do I know if I have a viral respiratory infection? Most viral respiratory infections cause:  A stuffy or runny nose.  Yellow or green nasal discharge.  A cough.  Sneezing.  Fatigue.  Achy muscles.  A sore throat.  Sweating or chills.  A fever.  A headache.  How are viral respiratory infections treated? If influenza is diagnosed early, it may be treated with an antiviral medicine that shortens the length of time a person has symptoms. Symptoms of viral respiratory infections may be treated with over-the-counter and prescription medicines, such as:  Expectorants. These make it easier to cough up mucus.  Decongestant nasal sprays.  Health care providers do not prescribe antibiotic medicines for viral infections. This is because antibiotics are designed to kill bacteria. They have no effect on viruses. How do I know if I should stay home from work or school? To avoid exposing others to your respiratory infection, stay home if you have:  A fever.  A persistent cough.  A sore throat.  A runny nose.  Sneezing.  Muscles aches.  Headaches.  Fatigue.  Weakness.  Chills.  Sweating.  Nausea.  Follow these instructions at home:  Rest as much as possible.  Take over-the-counter and prescription medicines only as told by your health care provider.  Drink enough fluid to keep your urine clear or pale yellow. This helps prevent dehydration and helps loosen up mucus.  Gargle with a salt-water mixture 3-4 times per day or  as needed. To make a salt-water mixture, completely dissolve -1 tsp of salt in 1 cup of warm water.  Use nose drops made from salt water to ease congestion and soften raw skin around your nose.  Do not drink alcohol.  Do not use tobacco products, including cigarettes, chewing tobacco, and e-cigarettes. If you need help quitting, ask your health care provider. Contact a health care provider if:  Your symptoms last for 10 days or longer.  Your symptoms get worse over time.  You have a fever.  You have severe sinus pain in your face or forehead.  The glands in your jaw or neck become very swollen. Get help right away if:  You feel pain or pressure in your chest.  You have shortness of breath.  You faint or feel like you will faint.  You have severe and persistent vomiting.  You feel confused or disoriented. This information is not intended to replace advice given to you by your health care provider. Make sure you discuss any questions you have with your health care provider. Document Released: 02/04/2005 Document Revised: 10/03/2015 Document Reviewed: 10/03/2014 Elsevier Interactive Patient Education  2018 [employer].  Otitis Media, Adult Otitis media occurs when there is inflammation and fluid in the middle ear. Your middle ear is a part of the ear that contains bones for hearing as well as air that helps send sounds to your brain. What are the causes? This condition is caused by a blockage in the eustachian tube. This tube drains fluid from the ear to the back of the nose (nasopharynx). A blockage in  this tube can be caused by an object or by swelling (edema) in the tube. Problems that can cause a blockage include:  A cold or other upper respiratory infection.  Allergies.  An irritant, such as tobacco smoke.  Enlarged adenoids. The adenoids are areas of soft tissue located high in the back of the throat, behind the nose and the roof of the mouth.  A mass in the  nasopharynx.  Damage to the ear caused by pressure changes (barotrauma).  What are the signs or symptoms? Symptoms of this condition include:  Ear pain.  A fever.  Decreased hearing.  A headache.  Tiredness (lethargy).  Fluid leaking from the ear.  Ringing in the ear.  How is this diagnosed? This condition is diagnosed with a physical exam. During the exam your health care provider will use an instrument called an otoscope to look into your ear and check for redness, swelling, and fluid. He or she will also ask about your symptoms. Your health care provider may also order tests, such as:  A test to check the movement of the eardrum (pneumatic otoscopy). This test is done by squeezing a small amount of air into the ear.  A test that changes air pressure in the middle ear to check how well the eardrum moves and whether the eustachian tube is working (tympanogram).  How is this treated? This condition usually goes away on its own within 3-5 days. But if the condition is caused by a bacteria infection and does not go away own its own, or keeps coming back, your health care provider may:  Prescribe antibiotic medicines to treat the infection.  Prescribe or recommend medicines to control pain.  Follow these instructions at home:  Take over-the-counter and prescription medicines only as told by your health care provider.  If you were prescribed an antibiotic medicine, take it as told by your health care provider. Do not stop taking the antibiotic even if you start to feel better.  Keep all follow-up visits as told by your health care provider. This is important. Contact a health care provider if:  You have bleeding from your nose.  There is a lump on your neck.  You are not getting better in 5 days.  You feel worse instead of better. Get help right away if:  You have severe pain that is not controlled with medicine.  You have swelling, redness, or pain around your  ear.  You have stiffness in your neck.  A part of your face is paralyzed.  The bone behind your ear (mastoid) is tender when you touch it.  You develop a severe headache. Summary  Otitis media is redness, soreness, and swelling of the middle ear.  This condition usually goes away on its own within 3-5 days.  If the problem does not go away in 3-5 days, your health care provider may prescribe or recommend medicines to treat your symptoms.  If you were prescribed an antibiotic medicine, take it as told by your health care provider. This information is not intended to replace advice given to you by your health care provider. Make sure you discuss any questions you have with your health care provider. Document Released: 01/31/2004 Document Revised: 04/17/2016 Document Reviewed: 04/17/2016 Elsevier Interactive Patient Education  2018 [place].   Sinus Rinse What is a sinus rinse? A sinus rinse is a simple home treatment that is used to rinse your sinuses with a sterile mixture of salt and water (saline solution). Sinuses  are air-filled spaces in your skull behind the bones of your face and forehead that open into your nasal cavity. You will use the following:  Saline solution.  Neti pot or spray bottle. This releases the saline solution into your nose and through your sinuses. Neti pots and spray bottles can be purchased at [employer], a health food store, or online.  When would I do a sinus rinse? A sinus rinse can help to clear mucus, dirt, dust, or pollen from the nasal cavity. You may do a sinus rinse when you have a cold, a virus, nasal allergy symptoms, a sinus infection, or stuffiness in the nose or sinuses. If you are considering a sinus rinse:  Ask your child's health care provider before performing a sinus rinse on your child.  Do not do a sinus rinse if you have had ear or nasal surgery, ear infection, or blocked ears.  How do I do a sinus rinse?  Wash your  hands.  Disinfect your device according to the directions provided and then dry it.  Use the solution that comes with your device or one that is sold separately in stores. Follow the mixing directions on the package.  Fill your device with the amount of saline solution as directed by the device instructions.  Stand over a sink and tilt your head sideways over the sink.  Place the spout of the device in your upper nostril (the one closer to the ceiling).  Gently pour or squeeze the saline solution into the nasal cavity. The liquid should drain to the lower nostril if you are not overly congested.  Gently blow your nose. Blowing too hard may cause ear pain.  Repeat in the other nostril.  Clean and rinse your device with clean water and then air-dry it. Are there risks of a sinus rinse? Sinus rinse is generally very safe and effective. However, there are a few risks, which include:  A burning sensation in the sinuses. This may happen if you do not make the saline solution as directed. Make sure to follow all directions when making the saline solution.  Infection from contaminated water. This is rare, but possible.  Nasal irritation.  This information is not intended to replace advice given to you by your health care provider. Make sure you discuss any questions you have with your health care provider. Document Released: 11/22/2013 Document Revised: 03/24/2016 Document Reviewed: 09/12/2013 Elsevier Interactive Patient Education  2017 [employer].

## 2018-04-19 NOTE — Progress Notes (Signed)
Subjective:    Patient ID: Monica Wyatt, female    DOB: [birth year], 62 y.o.   MRN: [id number]  62y/o Caucasian established female pt c/o nonproductive cough x1 week. She feels like she has a lot of chest congestion though that she cannot get out. Reports her husband has had same sx for an additional week and he has been seen by PCP and UC. Dx'd with bronchitis and given two different cough syrups. Pt does not recall the name of the meds but began taking the first one Rx'd herself a few days ago for her own cough (chart review Epic noted hycodan first Rx spouse and tussionex second Rx for spouse). It has been working "so-so." States only one or two doses left of that and OTC cough syrups did not help. Denies sinus pressure/pain. Woke up with L otalgia yesterday, took Ibuprofen 600mg  for that. +Sore throat.  Denied PND/rhinitis/headache/sore throat/ear discharge/fever/COPD or asthma history.  + sick contacts at work viral URI.     Review of Systems  Constitutional: Negative for activity change, appetite change, chills, diaphoresis, fatigue, fever and unexpected weight change.  HENT: Positive for congestion and ear pain. Negative for dental problem, drooling, ear discharge, facial swelling, hearing loss, mouth sores, nosebleeds, postnasal drip, rhinorrhea, sinus pressure, sinus pain, sneezing, sore throat, tinnitus, trouble swallowing and voice change.   Eyes: Negative for photophobia, pain, discharge, redness, itching and visual disturbance.  Respiratory: Positive for cough. Negative for choking, chest tightness, shortness of breath, wheezing and stridor.   Cardiovascular: Negative for chest pain, palpitations and leg swelling.  Gastrointestinal: Negative for abdominal distention, abdominal pain, diarrhea, nausea and vomiting.  Endocrine: Negative for cold intolerance and heat intolerance.  Genitourinary: Negative for difficulty urinating, dysuria and hematuria.  Musculoskeletal: Negative for  arthralgias, back pain, gait problem, joint swelling, myalgias, neck pain and neck stiffness.  Skin: Negative for color change, pallor, rash and wound.  Allergic/Immunologic: Negative for environmental allergies and food allergies.  Neurological: Negative for dizziness, tremors, seizures, syncope, facial asymmetry, speech difficulty, weakness, light-headedness, numbness and headaches.  Hematological: Negative for adenopathy. Does not bruise/bleed easily.  Psychiatric/Behavioral: Negative for agitation, behavioral problems, confusion and sleep disturbance.       Objective:   Physical Exam  Constitutional: She is oriented to person, place, and time. Vital signs are normal. She appears well-developed and well-nourished. She is active and cooperative.  Non-toxic appearance. She does not have a sickly appearance. She does not appear ill. No distress.  HENT:  Head: Normocephalic and atraumatic.  Right Ear: Hearing, external ear and ear canal normal. A middle ear effusion is present.  Left Ear: Hearing, external ear and ear canal normal. Tympanic membrane is injected, erythematous and bulging. A middle ear effusion is present. There is hemotympanum.  Nose: Mucosal edema and rhinorrhea present. No nose lacerations, sinus tenderness, nasal deformity, septal deviation or nasal septal hematoma. No epistaxis.  No foreign bodies. Right sinus exhibits no maxillary sinus tenderness and no frontal sinus tenderness. Left sinus exhibits no maxillary sinus tenderness and no frontal sinus tenderness.  Mouth/Throat: Uvula is midline and mucous membranes are normal. Mucous membranes are not pale, not dry and not cyanotic. She does not have dentures. No oral lesions. No trismus in the jaw. Normal dentition. No dental abscesses, uvula swelling, lacerations or dental caries. Posterior oropharyngeal edema and posterior oropharyngeal erythema present. No oropharyngeal exudate or tonsillar abscesses. Tonsils are 0 on the  right. Tonsils are 0 on the left. No tonsillar exudate.  Bilateral allergic shiners; cobblestoning posterior pharynx; bilateral TMs air fluid level clear-left TM vasculature inflamed/hemotympanum 50% and erythema 75% of TM no discharge in auditory canal or sediment noted; clear discharge noted bilateral nasal turbinates edema/erythema  Eyes: Pupils are equal, round, and reactive to light. Conjunctivae, EOM and lids are normal. Right eye exhibits no chemosis, no discharge, no exudate and no hordeolum. No foreign body present in the right eye. Left eye exhibits no chemosis, no discharge, no exudate and no hordeolum. No foreign body present in the left eye. Right conjunctiva is not injected. Right conjunctiva has no hemorrhage. Left conjunctiva is not injected. Left conjunctiva has no hemorrhage. No scleral icterus. Right eye exhibits normal extraocular motion and no nystagmus. Left eye exhibits normal extraocular motion and no nystagmus. Right pupil is round and reactive. Left pupil is round and reactive. Pupils are equal.  Neck: Trachea normal, normal range of motion and phonation normal. Neck supple. No tracheal tenderness, no spinous process tenderness and no muscular tenderness present. No neck rigidity. No tracheal deviation, no edema, no erythema and normal range of motion present. No thyroid mass and no thyromegaly present.  Cardiovascular: Normal rate, regular rhythm, S1 normal, S2 normal, normal heart sounds and intact distal pulses. PMI is not displaced. Exam reveals no gallop and no friction rub.  No murmur heard. Pulmonary/Chest: Effort normal and breath sounds normal. No accessory muscle usage or stridor. No respiratory distress. She has no decreased breath sounds. She has no wheezes. She has no rhonchi. She has no rales. She exhibits no tenderness.  No cough observed in exam room; spoke full sentences without difficulty  Abdominal: Soft. Normal appearance. She exhibits no distension, no fluid  wave and no ascites. There is no rigidity and no guarding.  Musculoskeletal: Normal range of motion. She exhibits no edema or tenderness.       Right shoulder: Normal.       Left shoulder: Normal.       Right elbow: Normal.      Left elbow: Normal.       Right hip: Normal.       Left hip: Normal.       Right knee: Normal.       Left knee: Normal.       Cervical back: Normal.       Thoracic back: Normal.       Lumbar back: Normal.       Right hand: Normal.       Left hand: Normal.  Lymphadenopathy:       Head (right side): No submental, no submandibular, no tonsillar, no preauricular, no posterior auricular and no occipital adenopathy present.       Head (left side): No submental, no submandibular, no tonsillar, no preauricular, no posterior auricular and no occipital adenopathy present.    She has no cervical adenopathy.       Right cervical: No superficial cervical, no deep cervical and no posterior cervical adenopathy present.      Left cervical: No superficial cervical, no deep cervical and no posterior cervical adenopathy present.  Neurological: She is alert and oriented to person, place, and time. She has normal strength. She is not disoriented. She displays no atrophy and no tremor. No cranial nerve deficit or sensory deficit. She exhibits normal muscle tone. She displays no seizure activity. Coordination and gait normal. GCS eye subscore is 4. GCS verbal subscore is 5. GCS motor subscore is 6.  In/out of chair without difficulty; gait sure  and steady in hallway  Skin: Skin is warm, dry and intact. Capillary refill takes less than 2 seconds. No abrasion, no bruising, no burn, no ecchymosis, no laceration, no lesion, no petechiae and no rash noted. She is not diaphoretic. No cyanosis or erythema. No pallor. Nails show no clubbing.  Psychiatric: She has a normal mood and affect. Her speech is normal and behavior is normal. Judgment and thought content normal. She is not actively  hallucinating. Cognition and memory are normal. She is attentive.  Nursing note and vitals reviewed.         Assessment & Plan:  A-Viral URI with cough and left acute otitis media nonrecurrent  P-patient with allergy to penicillin.  Has taken azithromycin in 2013 without difficulty per paper chart review.  Dispensed from [facility] azithromycin 250mg  take 2 po now then take 1 tablet daily days 2-5 #6 RF0.  Tylenol 1000mg  po QID prn pain OTC.  Supportive treatment.   No evidence of invasive bacterial infection, non toxic and well hydrated.  This is most likely self limiting viral infection.  I do not see where any further testing or imaging is necessary at this time.   I will suggest supportive care, rest, good hygiene and encourage the patient to take adequate fluids.  The patient is to return to clinic or EMERGENCY ROOM if symptoms worsen or change significantly e.g. ear pain, fever, purulent discharge from ears or bleeding.  Exitcare handout on otitis media.  Patient verbalized agreement and understanding of treatment plan.    Paper Rx hycodan 23ml po q4h prn cough #120 RF0  Reviewed [place] PMP Website had one Rx on file in past 2 years hydrocodone 20 tabs Mar 2018.  Discussed with patient I will not be able to refill Rx due to contract limitations in this clinic and she would need to follow up with PCM or UC provider.  Discussed need to dry up postnasal drip as probable cause of her cough.  Patient may use normal saline nasal spray 2 sprays each nostril q2h wa as needed. consider flonase 49mcg 1 spray each nostril BID OTC Patient denied personal or family history of ENT cancer.  OTC antihistamine of choice claritin/zyrtec 10mg  po daily. And/or phenylephrine 10mg  po q6h prn rhinitis OTC.  Cough lozenges po q2h prn cough.  Honey with lemon.  Hydrate to avoid dehydration.  Avoid driving for 8 hours after taking hycodan and avoid alcohol intake while taking this narcotic cough medicine.  It can cause drowsiness and  constipation.   Avoid triggers if possible.  Shower prior to bedtime if exposed to triggers.  If allergic dust/dust mites recommend mattress/pillow covers/encasements; washing linens, vacuuming, sweeping, dusting weekly.  Call or return to clinic as needed if these symptoms worsen or fail to improve as anticipated.  If worsening respiratory symptoms consider prednisone burst/taper and/or albuterol MDI also.   Exitcare handout on viral URI with cough, sinus rinse.  Patient verbalized understanding of instructions, agreed with plan of care and had no further questions at this time.  P2:  Avoidance and hand washing.

## 2018-06-14 ENCOUNTER — Other Ambulatory Visit: Payer: PRIVATE HEALTH INSURANCE

## 2018-07-13 ENCOUNTER — Ambulatory Visit
Admission: RE | Admit: 2018-07-13 | Discharge: 2018-07-13 | Disposition: A | Discharge status: Home / Self Care | Payer: PRIVATE HEALTH INSURANCE | Source: Ambulatory Visit | Attending: Obstetrics | Admitting: Obstetrics

## 2018-07-13 DIAGNOSIS — M810 Age-related osteoporosis without current pathological fracture: Secondary | ICD-10-CM | POA: Diagnosis present

## 2018-07-18 NOTE — Progress Notes (Signed)
Subjective:    Patient ID: Monica Wyatt, female    DOB: [birth year], 63 y.o.   MRN: [id number]  HPI:  10/14/17 OV: [name] presents for f/u: HLD, and to discuss lab results from April 2019. She denies acute complaints or dramatic changes in health since initial OV in Jan 2018 Last Lipid panel- The 10-year ASCVD risk score [name] DC Brooke Bonito., et al., 2013) is: 1.6%   Values used to calculate the score:     Age: 34 years     Sex: Female     Is Non-Hispanic African American: No     Diabetic: No     Tobacco smoker: No     Systolic Blood Pressure: 93 mmHg     Is BP treated: No     HDL Cholesterol: 51 mg/dL     Total Cholesterol: 139 mg/dL  LDL- 72  She is compliant on Atorvastatin 20mg  QD, denies myalgia's Successful hernia repair Spring 2018 She reports insomnia- difficulty falling.remaining asleep. She was unable to complete Colonoscopy (intolerate prep), requests order for Cologuard. She reports being started on alendronate (Fosamax) 70mg  once weekly by her OB/GYN >12 months ago  01/20/18 OV: [name] is here for CPE. She reports medication compliance, denies SE She has been trying to reduce CHO intake  She denies formal exercise, however remains active with house/yard work and FT position at [employer] She estimates to drink >50 oz water/day and continues to abstain from ETOH/tobacco She denies CP/dyspnea/dizziness/HA/dizziness/palpitations  Healthcare Maintenance: PAP-UTD, completed with OB/GYN Mammogram-UTD, completed with OB/GYN Immunizations-UTD Colorguard- Negative  07/20/2018 OV: [name] is here for 6 month f/u: HLD, Pre-Diabetes BP and weight are stable She reports enjoying the occasional sweet and remains active with "fast walking" at work M-F She denies CP/dyspnea/dizziness/palpitations She continues to abstain from tobacco/vape/ETOH use She had DEXA scan last week, has been on Aldendronate (Fosamax) 70mg  since 2017- denies SE Fasting labs obtained today    Patient Care Team    Relationship Specialty Notifications Start End  Mina Marble D, NP PCP - General Family Medicine  06/08/16   [name], MD Consulting Physician Dermatology  06/08/16   Aloha Gell, MD Consulting Physician Obstetrics and Gynecology  06/08/16     Patient Active Problem List   Diagnosis Date Noted  . Healthcare maintenance 10/14/2017  . Insomnia 10/14/2017  . Pre-diabetes 10/14/2017  . Screening for colon cancer 10/14/2017  . Osteoporosis 03/18/2017  . Hyperlipidemia 06/08/2016  . Chronic tension-type headache, not intractable 06/08/2016     Past Medical History:  Diagnosis Date  . Headache(784.0)   . Hyperlipidemia   . Medical history non-contributory   . Osteoporosis      Past Surgical History:  Procedure Laterality Date  . HERNIA REPAIR    . HYSTEROSCOPY W/D&C N/A 07/22/2012   Procedure: DILATATION AND CURETTAGE /HYSTEROSCOPY;  Surgeon: [name]. [name], MD;  Location: [facility];  Service: Gynecology;  Laterality: N/A;  . LAPAROSCOPY N/A 07/22/2012   Procedure: LAPAROSCOPY OPERATIVE;  Surgeon: [name] [name], MD;  Location: [facility];  Service: Gynecology;  Laterality: N/A;  . NO PAST SURGERIES    . SALPINGOOPHORECTOMY Bilateral 07/22/2012   Procedure: SALPINGO OOPHORECTOMY;  Surgeon: [name]. [name], MD;  Location: [facility];  Service: Gynecology;  Laterality: Bilateral;     Family History  Problem Relation Age of Onset  . Dementia Mother   . COPD Father   . Healthy Sister   . Alcohol abuse Brother   . Sleep apnea  Son   . Healthy Sister      Social History   Substance and Sexual Activity  Drug Use No     Social History   Substance and Sexual Activity  Alcohol Use No     Social History   Tobacco Use  Smoking Status Never Smoker  Smokeless Tobacco Never Used     Outpatient Encounter Medications as of 07/20/2018  Medication Sig  . alendronate (FOSAMAX) 70 MG tablet Take 1 tablet by mouth once a week.  [name] atorvastatin  (LIPITOR) 20 MG tablet Take 1 tablet (20 mg total) by mouth daily.  . calcium elemental as carbonate (BARIATRIC TUMS ULTRA) 400 MG chewable tablet Chew 2.5 tablets by mouth as needed every 8 hours as needed for heartburn  . Cholecalciferol (VITAMIN D3) 2000 units TABS Take 1 tablet by mouth daily.  [name] ibuprofen (ADVIL) 200 MG tablet Take 3 tablets (600 mg total) by mouth every 6 (six) hours as needed for pain.  . metroNIDAZOLE (METROGEL) 1 % gel   . sodium chloride (OCEAN) 0.65 % SOLN nasal spray Place 2 sprays into both nostrils every 2 (two) hours while awake.  . [DISCONTINUED] azithromycin (ZITHROMAX) 250 MG tablet Take 2 tablets by mouth now then 1 tablet by mouth daily days 2 through 5   No facility-administered encounter medications on file as of 07/20/2018.     Allergies: Penicillins  Body mass index is 21.84 kg/m.  Blood pressure 98/62, pulse 70, temperature 98.5 F (36.9 C), temperature source Oral, height 5\' 3"  (1.6 m), weight 123 lb 4.8 oz (55.9 kg), SpO2 97 %.  Review of Systems  Constitutional: Positive for fatigue. Negative for activity change, appetite change, chills, diaphoresis, fever and unexpected weight change.  HENT: Negative for congestion.   Eyes: Negative for visual disturbance.  Respiratory: Negative for cough, chest tightness, shortness of breath, wheezing and stridor.   Cardiovascular: Negative for chest pain, palpitations and leg swelling.  Gastrointestinal: Negative for abdominal distention, anal bleeding, blood in stool, constipation, diarrhea, nausea and vomiting.  Endocrine: Negative for cold intolerance, heat intolerance, polydipsia, polyphagia and polyuria.  Genitourinary: Negative for difficulty urinating, flank pain and hematuria.  Musculoskeletal: Negative for arthralgias, back pain, gait problem, joint swelling, myalgias, neck pain and neck stiffness.  Skin: Negative for color change, pallor, rash and wound.  Neurological: Negative for dizziness and  headaches.  Hematological: Does not bruise/bleed easily.  Psychiatric/Behavioral: Positive for sleep disturbance. Negative for behavioral problems, confusion, decreased concentration, dysphoric mood, hallucinations, self-injury and suicidal ideas. The patient is not nervous/anxious and is not hyperactive.       Objective:   Physical Exam Constitutional:      General: She is not in acute distress.    Appearance: Normal appearance. She is well-developed and normal weight. She is not ill-appearing, toxic-appearing or diaphoretic.  HENT:     Head: Normocephalic and atraumatic.     Right Ear: External ear normal. No decreased hearing noted. Tympanic membrane is not erythematous or bulging.     Left Ear: External ear normal. No decreased hearing noted. Tympanic membrane is not erythematous or bulging.     Nose: Nose normal. No mucosal edema.     Right Sinus: No maxillary sinus tenderness or frontal sinus tenderness.     Left Sinus: No maxillary sinus tenderness or frontal sinus tenderness.     Mouth/Throat:     Pharynx: Uvula midline.     Tonsils: No tonsillar exudate or tonsillar abscesses. Swelling: 0 on the right.  0 on the left.  Eyes:     Conjunctiva/sclera: Conjunctivae normal.     Pupils: Pupils are equal, round, and reactive to light.  Neck:     Musculoskeletal: Normal range of motion.  Cardiovascular:     Rate and Rhythm: Normal rate and regular rhythm.     Heart sounds: Normal heart sounds. No murmur.  Pulmonary:     Effort: Pulmonary effort is normal. No respiratory distress.     Breath sounds: Normal breath sounds. No stridor. No wheezing, rhonchi or rales.  Chest:     Chest wall: No tenderness.  Musculoskeletal: Normal range of motion.        General: No tenderness.  Lymphadenopathy:     Cervical: No cervical adenopathy.  Skin:    General: Skin is warm and dry.     Capillary Refill: Capillary refill takes less than 2 seconds.     Coloration: Skin is not pale.      Findings: No erythema or rash.  Neurological:     Mental Status: She is alert and oriented to person, place, and time.     Coordination: Coordination normal.  Psychiatric:        Mood and Affect: Affect is blunt.        Speech: Speech normal.        Behavior: Behavior normal.        Thought Content: Thought content normal.        Judgment: Judgment normal.     Comments: Blunt affect is her baseline       Assessment & Plan:   1. Hyperlipidemia, unspecified hyperlipidemia type   2. Pre-diabetes   3. Healthcare maintenance   4. Age-related osteoporosis without current pathological fracture     Healthcare maintenance Your A1c (average of your blood sugar over the last 3 months) is stable at 5.8- minimally pre-diabetic Normal A1c <5.7 Pre- Diabetic 5.7-6.4 Diabetic >6.4 Continue to remain as active as possible and limit sugar and carbohydrates. We will call you when lab results are available. Follow-up 6 months for complete physical.  Pre-diabetes Lab Results  Component Value Date   HGBA1C 5.8 (A) 07/20/2018   HGBA1C 5.8 (H) 01/20/2018   HGBA1C 5.8 (H) 08/17/2017   Stable Continue to reduce CHO/sugar Remain as active as possible   Osteoporosis DEXA scan completed last week She had DEXA scan last week, has been on Aldendronate (Fosamax) 70mg  since 2017- denies SE    FOLLOW-UP:  Return in about 6 months (around 01/20/2019) for CPE.

## 2018-07-20 ENCOUNTER — Encounter: Payer: Self-pay | Admitting: Adult Health

## 2018-07-20 ENCOUNTER — Other Ambulatory Visit: Payer: Self-pay

## 2018-07-20 ENCOUNTER — Ambulatory Visit (INDEPENDENT_AMBULATORY_CARE_PROVIDER_SITE_OTHER): Payer: PRIVATE HEALTH INSURANCE | Admitting: Adult Health

## 2018-07-20 VITALS — BP 98/62 | HR 70 | Temp 98.5°F | Ht 63.0 in | Wt 123.3 lb

## 2018-07-20 DIAGNOSIS — E785 Hyperlipidemia, unspecified: Secondary | ICD-10-CM

## 2018-07-20 DIAGNOSIS — R7303 Prediabetes: Secondary | ICD-10-CM | POA: Diagnosis not present

## 2018-07-20 DIAGNOSIS — M810 Age-related osteoporosis without current pathological fracture: Secondary | ICD-10-CM

## 2018-07-20 DIAGNOSIS — Z0000 Encounter for general adult medical examination without abnormal findings: Secondary | ICD-10-CM

## 2018-07-20 LAB — POCT GLYCOSYLATED HEMOGLOBIN (HGB A1C): Hemoglobin A1C: 5.8 % — AB (ref 4.0–5.6)

## 2018-07-20 NOTE — Assessment & Plan Note (Signed)
Your A1c (average of your blood sugar over the last 3 months) is stable at 5.8- minimally pre-diabetic Normal A1c <5.7 Pre- Diabetic 5.7-6.4 Diabetic >6.4 Continue to remain as active as possible and limit sugar and carbohydrates. We will call you when lab results are available. Follow-up 6 months for complete physical.

## 2018-07-20 NOTE — Assessment & Plan Note (Signed)
DEXA scan completed last week She had DEXA scan last week, has been on Aldendronate (Fosamax) 70mg  since 2017- denies SE

## 2018-07-20 NOTE — Assessment & Plan Note (Signed)
Lab Results  Component Value Date   HGBA1C 5.8 (A) 07/20/2018   HGBA1C 5.8 (H) 01/20/2018   HGBA1C 5.8 (H) 08/17/2017   Stable Continue to reduce CHO/sugar Remain as active as possible

## 2018-07-20 NOTE — Patient Instructions (Addendum)
Mediterranean Diet A Mediterranean diet refers to food and lifestyle choices that are based on the traditions of countries located on the [employer]. This way of eating has been shown to help prevent certain conditions and improve outcomes for people who have chronic diseases, like kidney disease and heart disease. What are tips for following this plan? Lifestyle  Cook and eat meals together with your family, when possible.  Drink enough fluid to keep your urine clear or pale yellow.  Be physically active every day. This includes: ? Aerobic exercise like running or swimming. ? Leisure activities like gardening, walking, or housework.  Get 7-8 hours of sleep each night.  If recommended by your health care provider, drink red wine in moderation. This means 1 glass a day for nonpregnant women and 2 glasses a day for men. A glass of wine equals 5 oz (150 mL). Reading food labels   Check the serving size of packaged foods. For foods such as rice and pasta, the serving size refers to the amount of cooked product, not dry.  Check the total fat in packaged foods. Avoid foods that have saturated fat or trans fats.  Check the ingredients list for added sugars, such as corn syrup. Shopping  At the grocery store, buy most of your food from the areas near the walls of the store. This includes: ? Fresh fruits and vegetables (produce). ? Grains, beans, nuts, and seeds. Some of these may be available in unpackaged forms or large amounts (in bulk). ? Fresh seafood. ? Poultry and eggs. ? Low-fat dairy products.  Buy whole ingredients instead of prepackaged foods.  Buy fresh fruits and vegetables in-season from local farmers markets.  Buy frozen fruits and vegetables in resealable bags.  If you do not have access to quality fresh seafood, buy precooked frozen shrimp or canned fish, such as tuna, salmon, or sardines.  Buy small amounts of raw or cooked vegetables, salads, or olives from  the deli or salad bar at your store.  Stock your pantry so you always have certain foods on hand, such as olive oil, canned tuna, canned tomatoes, rice, pasta, and beans. Cooking  Cook foods with extra-virgin olive oil instead of using butter or other vegetable oils.  Have meat as a side dish, and have vegetables or grains as your main dish. This means having meat in small portions or adding small amounts of meat to foods like pasta or stew.  Use beans or vegetables instead of meat in common dishes like chili or lasagna.  Experiment with different cooking methods. Try roasting or broiling vegetables instead of steaming or sauteing them.  Add frozen vegetables to soups, stews, pasta, or rice.  Add nuts or seeds for added healthy fat at each meal. You can add these to yogurt, salads, or vegetable dishes.  Marinate fish or vegetables using olive oil, lemon juice, garlic, and fresh herbs. Meal planning   Plan to eat 1 vegetarian meal one day each week. Try to work up to 2 vegetarian meals, if possible.  Eat seafood 2 or more times a week.  Have healthy snacks readily available, such as: ? Vegetable sticks with hummus. ? [place] yogurt. ? Fruit and nut trail mix.  Eat balanced meals throughout the week. This includes: ? Fruit: 2-3 servings a day ? Vegetables: 4-5 servings a day ? Low-fat dairy: 2 servings a day ? Fish, poultry, or lean meat: 1 serving a day ? Beans and legumes: 2 or more servings a week ?  Nuts and seeds: 1-2 servings a day ? Whole grains: 6-8 servings a day ? Extra-virgin olive oil: 3-4 servings a day  Limit red meat and sweets to only a few servings a month What are my food choices?  Mediterranean diet ? Recommended ? Grains: Whole-grain pasta. Brown rice. Bulgar wheat. Polenta. Couscous. Whole-wheat bread. [place]. ? Vegetables: Artichokes. Beets. Broccoli. Cabbage. Carrots. Eggplant. Green beans. Chard. Kale. Spinach. Onions. Leeks. Peas. Squash.  Tomatoes. Peppers. Radishes. ? Fruits: Apples. Apricots. Avocado. Berries. Bananas. Cherries. Dates. Figs. Grapes. Lemons. Melon. Oranges. Peaches. Plums. Pomegranate. ? Meats and other protein foods: Beans. Almonds. Sunflower seeds. Pine nuts. Peanuts. [place]. Salmon. Scallops. Shrimp. [place]. Tilapia. Clams. Oysters. Eggs. ? Dairy: Low-fat milk. Cheese. Greek yogurt. ? Beverages: Water. Red wine. Herbal tea. ? Fats and oils: Extra virgin olive oil. Avocado oil. Grape seed oil. ? Sweets and desserts: [place] yogurt with honey. Baked apples. Poached pears. Trail mix. ? Seasoning and other foods: Basil. Cilantro. Coriander. Cumin. Mint. Parsley. Sage. Rosemary. Tarragon. Garlic. Oregano. Thyme. Pepper. Balsalmic vinegar. Tahini. Hummus. Tomato sauce. Olives. Mushrooms. ? Limit these ? Grains: Prepackaged pasta or rice dishes. Prepackaged cereal with added sugar. ? Vegetables: Deep fried potatoes (french fries). ? Fruits: Fruit canned in syrup. ? Meats and other protein foods: Beef. Pork. Lamb. Poultry with skin. Hot dogs. Berniece Salines. ? Dairy: Ice cream. Sour cream. Whole milk. ? Beverages: Juice. Sugar-sweetened soft drinks. Beer. Liquor and spirits. ? Fats and oils: Butter. Canola oil. Vegetable oil. Beef fat (tallow). Lard. ? Sweets and desserts: Cookies. Cakes. Pies. Candy. ? Seasoning and other foods: Mayonnaise. Premade sauces and marinades. ? The items listed may not be a complete list. Talk with your dietitian about what dietary choices are right for you. Summary  The Mediterranean diet includes both food and lifestyle choices.  Eat a variety of fresh fruits and vegetables, beans, nuts, seeds, and whole grains.  Limit the amount of red meat and sweets that you eat.  Talk with your health care provider about whether it is safe for you to drink red wine in moderation. This means 1 glass a day for nonpregnant women and 2 glasses a day for men. A glass of wine equals 5 oz (150 mL). This information  is not intended to replace advice given to you by your health care provider. Make sure you discuss any questions you have with your health care provider. Document Released: 12/19/2015 Document Revised: 01/21/2016 Document Reviewed: 12/19/2015 Elsevier Interactive Patient Education  2019 [employer].  Your A1c (average of your blood sugar over the last 3 months) is stable at 5.8- minimally pre-diabetic Normal A1c <5.7 Pre- Diabetic 5.7-6.4 Diabetic >6.4 Continue to remain as active as possible and limit sugar and carbohydrates. We will call you when lab results are available. Follow-up 6 months for complete physical. GREAT TO SEE YOU!

## 2018-07-21 ENCOUNTER — Ambulatory Visit: Payer: PRIVATE HEALTH INSURANCE | Admitting: Adult Health

## 2018-07-21 ENCOUNTER — Ambulatory Visit: Payer: Self-pay | Admitting: Registered Nurse

## 2018-07-21 VITALS — BP 112/58 | HR 60 | Temp 98.4°F

## 2018-07-21 DIAGNOSIS — N3001 Acute cystitis with hematuria: Secondary | ICD-10-CM

## 2018-07-21 LAB — POCT URINALYSIS DIPSTICK
Bilirubin, UA: NEGATIVE
Glucose, UA: NEGATIVE
Ketones, POC UA: NEGATIVE mg/dL
NITRITE UA: POSITIVE — AB
Specific Gravity, UA: 1.01 (ref 1.005–1.030)
Urobilinogen, UA: 0.2 E.U./dL
pH, UA: 7.5 (ref 5.0–8.0)

## 2018-07-21 LAB — LIPID PANEL
Chol/HDL Ratio: 3 ratio (ref 0.0–4.4)
Cholesterol, Total: 151 mg/dL (ref 100–199)
HDL: 50 mg/dL (ref >39)
LDL Calculated: 85 mg/dL (ref 0–99)
Triglycerides: 78 mg/dL (ref 0–149)
VLDL Cholesterol Cal: 16 mg/dL (ref 5–40)

## 2018-07-21 MED ORDER — ACETAMINOPHEN 500 MG PO TABS: 1000.0000 mg | ORAL_TABLET | Freq: Four times a day (QID) | ORAL | 0 refills | Status: AC | PRN

## 2018-07-21 MED ORDER — SULFAMETHOXAZOLE-TRIMETHOPRIM 800-160 MG PO TABS: 1.0000 | ORAL_TABLET | Freq: Two times a day (BID) | ORAL | 0 refills | Status: AC

## 2018-07-21 MED ORDER — PHENAZOPYRIDINE HCL 200 MG PO TABS: 200.0000 mg | ORAL_TABLET | Freq: Three times a day (TID) | ORAL | 0 refills | Status: AC

## 2018-07-21 NOTE — Progress Notes (Signed)
Subjective:    Patient ID: Monica Wyatt, female    DOB: June 18, 1955, 63 y.o.   MRN: [id number]  62y/o Caucasian established female pt c/o suprapubic pressure, urinary urgency and frequency, smelly urine and cloudy. Sx started yesterday. Hx of UTIs. Last one 2015 was given bactrim DS by PA Tamala Julian and sx resolved.   Denied swelling of hands/feet, headache, back pain, nausea or vomiting.  Felt a little hot/sweaty last night.   Started feeling bad evening yesterday noted smelly cloudy urine   Review of Systems  Constitutional: Positive for diaphoresis. Negative for activity change, appetite change, chills, fatigue and fever.  HENT: Negative for trouble swallowing and voice change.   Eyes: Negative for photophobia and visual disturbance.  Respiratory: Negative for cough, choking, shortness of breath, wheezing and stridor.   Cardiovascular: Negative for leg swelling.  Gastrointestinal: Negative for abdominal distention, constipation, diarrhea, nausea and vomiting.  Endocrine: Negative for cold intolerance and heat intolerance.  Genitourinary: Positive for dysuria, frequency and urgency. Negative for decreased urine volume, difficulty urinating, flank pain, genital sores, hematuria, menstrual problem, vaginal bleeding, vaginal discharge and vaginal pain.  Musculoskeletal: Negative for back pain, gait problem, joint swelling, myalgias, neck pain and neck stiffness.  Skin: Negative for color change, pallor, rash and wound.  Allergic/Immunologic: Negative for environmental allergies and food allergies.  Neurological: Negative for dizziness, tremors, seizures, syncope, facial asymmetry, speech difficulty, weakness, light-headedness, numbness and headaches.  Hematological: Negative for adenopathy. Does not bruise/bleed easily.  Psychiatric/Behavioral: Negative for agitation, confusion and sleep disturbance.       Objective:   Physical Exam Constitutional:      General: She is awake. She is not  in acute distress.    Appearance: Normal appearance. She is well-developed and well-groomed. She is not ill-appearing, toxic-appearing or diaphoretic.  HENT:     Head: Normocephalic and atraumatic.     Jaw: There is normal jaw occlusion.     Salivary Glands: Right salivary gland is not diffusely enlarged or tender. Left salivary gland is not diffusely enlarged or tender.     Right Ear: Hearing and external ear normal.     Left Ear: Hearing and external ear normal.     Nose: Nose normal.     Mouth/Throat:     Lips: Pink. No lesions.     Mouth: Mucous membranes are moist.     Tongue: No lesions.     Pharynx: Uvula midline. No pharyngeal swelling, oropharyngeal exudate or posterior oropharyngeal erythema.     Tonsils: No tonsillar exudate. Swelling: 0 on the right. 0 on the left.  Eyes:     General: Lids are normal. No visual field deficit.    Extraocular Movements: Extraocular movements intact.     Conjunctiva/sclera: Conjunctivae normal.     Pupils: Pupils are equal, round, and reactive to light.  Neck:     Musculoskeletal: Normal range of motion and neck supple. No neck rigidity.     Trachea: Trachea and phonation normal.  Cardiovascular:     Rate and Rhythm: Normal rate and regular rhythm.     Pulses: Normal pulses.          Radial pulses are 2+ on the right side and 2+ on the left side.     Heart sounds: Normal heart sounds. No murmur. No friction rub. No gallop.   Pulmonary:     Effort: Pulmonary effort is normal. No respiratory distress.     Breath sounds: Normal breath sounds and air entry.  No stridor, decreased air movement or transmitted upper airway sounds. No decreased breath sounds, wheezing, rhonchi or rales.  Abdominal:     General: Abdomen is flat. Bowel sounds are increased. There is no distension.     Palpations: Abdomen is soft. There is no shifting dullness, fluid wave, hepatomegaly, splenomegaly, mass or pulsatile mass.     Tenderness: There is abdominal tenderness  in the suprapubic area. There is no right CVA tenderness, left CVA tenderness, guarding or rebound. Negative signs include Murphy's sign.     Hernia: No hernia is present. There is no hernia in the umbilical area or ventral area.     Comments: Hyperactive bowel sounds x 4 quads; dull to percussion x 4 quads  Musculoskeletal: Normal range of motion.     Right shoulder: Normal.     Left shoulder: Normal.     Right elbow: Normal.    Left elbow: Normal.     Right hip: Normal.     Left hip: Normal.     Right knee: Normal.     Left knee: Normal.     Right ankle: Normal.     Left ankle: Normal.     Cervical back: Normal.     Thoracic back: Normal.     Lumbar back: Normal.     Right hand: Normal.     Left hand: Normal.     Right lower leg: No edema.     Left lower leg: No edema.  Lymphadenopathy:     Head:     Right side of head: No submental, submandibular, tonsillar, preauricular, posterior auricular or occipital adenopathy.     Left side of head: No submental, submandibular, tonsillar, preauricular, posterior auricular or occipital adenopathy.     Cervical: No cervical adenopathy.     Right cervical: No superficial cervical adenopathy.    Left cervical: No superficial cervical adenopathy.  Skin:    General: Skin is warm and dry.     Capillary Refill: Capillary refill takes less than 2 seconds.     Coloration: Skin is not ashen, cyanotic, jaundiced, mottled, pale or sallow.     Findings: No abrasion, acne, bruising, burn, ecchymosis, erythema, signs of injury, laceration, lesion, petechiae or rash.     Nails: There is no clubbing.   Neurological:     General: No focal deficit present.     Mental Status: She is alert and oriented to person, place, and time. Mental status is at baseline.     GCS: GCS eye subscore is 4. GCS verbal subscore is 5. GCS motor subscore is 6.     Cranial Nerves: Cranial nerves are intact. No cranial nerve deficit, dysarthria or facial asymmetry.     Sensory:  Sensation is intact. No sensory deficit.     Motor: Motor function is intact. No weakness, tremor, atrophy, abnormal muscle tone or seizure activity.     Coordination: Coordination is intact. Coordination normal.     Gait: Gait is intact. Gait normal.  Psychiatric:        Attention and Perception: Attention and perception normal.        Mood and Affect: Mood and affect normal.        Speech: Speech normal.        Behavior: Behavior normal. Behavior is cooperative.        Thought Content: Thought content normal.        Cognition and Memory: Cognition and memory normal.        Judgment:  Judgment normal.       Hyperactive bowel sounds x 4 quads negative cva tenderness of pedal edema; pressure with suprapubic palpation    Assessment & Plan:  A-UTI acute  P-discussed lab results with patient in clinic urine was cloudy, positive for blood, nitrite, leukocytes and protein which are indicators of infection or typically positive when infection occurring. She denied gross hematuria.  I requested she repeat dipstick urinalysis in 2 weeks. Sooner-Monday 16 Mar along with C&S if improving but not resolved with 5 days of bactrim ds po BID started today.  Per epic no history of C&S for urine. Push po fluids to keep urine clear yellow tinged. See PCM/UC provider if fever greater than 100.5 after 48 hours of bactrim, repetitive vomiting, worsening pain, gross hematuria, unable to urinate every 8 hours and/or tea/cola colored urine.  Bactrim ds po BID x 3 days #40 RF0 dispensed from [facility] to patient has tolerated well in the past if feeling better but not completely resolved keep taking bactrim DS po BID until Monday and notify RN Hildred Alamin.  If worsening after 48 hours use of bactrim DS 4 doses seek re-evaluation with PCM/UC.  Dipstick urinalysis follow up with RN Hildred Alamin in 2 weeks.  Medications as directed. Patient is also to push fluids and may use Pyridium 200mg  po TID as needed she refused Rx stated if needed  she will buy OTC Azo. Hydrate, avoid dehydration. Avoid holding urine void on frequent basis every 4 to 6 hours.Call or return to clinic as needed if these symptoms worsen or fail to improve as anticipated. Exitcare handout on acute UTI printed and given to patient  Patient verbalized agreement and understanding of treatment plan and had no further questions at this time.  P2: Hydrate and cranberry juice

## 2018-07-21 NOTE — Patient Instructions (Signed)

## 2018-08-01 ENCOUNTER — Other Ambulatory Visit: Payer: Self-pay | Admitting: Adult Health

## 2018-10-13 ENCOUNTER — Encounter: Payer: Self-pay | Admitting: Registered Nurse

## 2018-10-13 ENCOUNTER — Other Ambulatory Visit: Payer: Self-pay

## 2018-10-13 ENCOUNTER — Ambulatory Visit: Payer: Self-pay | Admitting: Registered Nurse

## 2018-10-13 VITALS — BP 113/72 | HR 63 | Temp 98.0°F

## 2018-10-13 DIAGNOSIS — S80862A Insect bite (nonvenomous), left lower leg, initial encounter: Secondary | ICD-10-CM

## 2018-10-13 DIAGNOSIS — W57XXXA Bitten or stung by nonvenomous insect and other nonvenomous arthropods, initial encounter: Secondary | ICD-10-CM

## 2018-10-13 MED ORDER — SULFAMETHOXAZOLE-TRIMETHOPRIM 800-160 MG PO TABS: 1.0000 | ORAL_TABLET | Freq: Two times a day (BID) | ORAL | 0 refills | Status: DC

## 2018-10-13 MED ORDER — DIPHENHYDRAMINE HCL 2 % EX GEL: 1.0000 "application " | Freq: Two times a day (BID) | CUTANEOUS | 0 refills | Status: AC | PRN

## 2018-10-13 MED ORDER — HYDROCORTISONE 1 % EX LOTN: 1.0000 "application " | TOPICAL_LOTION | Freq: Two times a day (BID) | CUTANEOUS | 0 refills | Status: DC

## 2018-10-13 NOTE — Patient Instructions (Signed)
Cellulitis, Adult  Cellulitis is a skin infection. The infected area is usually warm, red, swollen, and tender. This condition occurs most often in the arms and lower legs. The infection can travel to the muscles, blood, and underlying tissue and become serious. It is very important to get treated for this condition. What are the causes? Cellulitis is caused by bacteria. The bacteria enter through a break in the skin, such as a cut, burn, insect bite, open sore, or crack. What increases the risk? This condition is more likely to occur in people who:  Have a weak body defense system (immune system).  Have open wounds on the skin, such as cuts, burns, bites, and scrapes. Bacteria can enter the body through these open wounds.  Are older than 63 years of age.  Have diabetes.  Have a type of long-lasting (chronic) liver disease (cirrhosis) or kidney disease.  Are obese.  Have a skin condition such as: ? Itchy rash (eczema). ? Slow movement of blood in the veins (venous stasis). ? Fluid buildup below the skin (edema).  Have had radiation therapy.  Use IV drugs. What are the signs or symptoms? Symptoms of this condition include:  Redness, streaking, or spotting on the skin.  Swollen area of the skin.  Tenderness or pain when an area of the skin is touched.  Warm skin.  A fever.  Chills.  Blisters. How is this diagnosed? This condition is diagnosed based on a medical history and physical exam. You may also have tests, including:  Blood tests.  Imaging tests. How is this treated? Treatment for this condition may include:  Medicines, such as antibiotic medicines or medicines to treat allergies (antihistamines).  Supportive care, such as rest and application of cold or warm cloths (compresses) to the skin.  Hospital care, if the condition is severe. The infection usually starts to get better within 1-2 days of treatment. Follow these instructions at home:  Medicines   Take over-the-counter and prescription medicines only as told by your health care provider.  If you were prescribed an antibiotic medicine, take it as told by your health care provider. Do not stop taking the antibiotic even if you start to feel better. General instructions  Drink enough fluid to keep your urine pale yellow.  Do not touch or rub the infected area.  Raise (elevate) the infected area above the level of your heart while you are sitting or lying down.  Apply warm or cold compresses to the affected area as told by your health care provider.  Keep all follow-up visits as told by your health care provider. This is important. These visits let your health care provider make sure a more serious infection is not developing. Contact a health care provider if:  You have a fever.  Your symptoms do not begin to improve within 1-2 days of starting treatment.  Your bone or joint underneath the infected area becomes painful after the skin has healed.  Your infection returns in the same area or another area.  You notice a swollen bump in the infected area.  You develop new symptoms.  You have a general ill feeling (malaise) with muscle aches and pains. Get help right away if:  Your symptoms get worse.  You feel very sleepy.  You develop vomiting or diarrhea that persists.  You notice red streaks coming from the infected area.  Your red area gets larger or turns dark in color. These symptoms may represent a serious problem that is an  emergency. Do not wait to see if the symptoms will go away. Get medical help right away. Call your local emergency services (911 in the [place] Do not drive yourself to the hospital. Summary  Cellulitis is a skin infection. This condition occurs most often in the arms and lower legs.  Treatment for this condition may include medicines, such as antibiotic medicines or antihistamines.  Take over-the-counter and prescription medicines only as  told by your health care provider. If you were prescribed an antibiotic medicine, do not stop taking the antibiotic even if you start to feel better.  Contact a health care provider if your symptoms do not begin to improve within 1-2 days of starting treatment or your symptoms get worse.  Keep all follow-up visits as told by your health care provider. This is important. These visits let your health care provider make sure that a more serious infection is not developing. This information is not intended to replace advice given to you by your health care provider. Make sure you discuss any questions you have with your health care provider. Document Released: 02/04/2005 Document Revised: 09/16/2017 Document Reviewed: 09/16/2017 Elsevier Interactive Patient Education  2019 [employer], Adult An insect bite can make your skin red, itchy, and swollen. An insect bite is different from an insect sting, which happens when an insect injects poison (venom) into the skin. Some insects can spread disease to people through a bite. However, most insect bites do not lead to disease and are not serious. What are the causes? Insects may bite for a variety of reasons, including:  Hunger.  To defend themselves. Insects that bite include:  Spiders.  Mosquitoes.  Ticks.  Fleas.  Ants.  Flies.  Kissing bugs.  Chiggers. What are the signs or symptoms? Symptoms of this condition include:  Itching or pain in the bite area.  Redness and swelling in the bite area.  An open wound (skin ulcer). In many cases, symptoms last for 2-4 days. In rare cases, a person may have a severe allergic reaction (anaphylactic reaction) to a bite. Symptoms of an anaphylactic reaction may include:  Feeling warm in the face (flushed). This may include redness.  Itchy, red, swollen areas of skin (hives).  Swelling of the eyes, lips, face, mouth, tongue, or throat.  Difficulty breathing, speaking, or  swallowing.  Noisy breathing (wheezing).  Dizziness or light-headedness.  Fainting.  Pain or cramping in the abdomen.  Vomiting.  Diarrhea. How is this diagnosed? This condition is usually diagnosed based on symptoms and a physical exam. How is this treated? Treatment is usually not needed. Symptoms often go away on their own. When treatment is recommended, it may involve:  Applying a cream or lotion to the bite area. This treatment helps with itching.  Taking an antibiotic medicine. This treatment is needed if the bite area gets infected.  Getting a tetanus shot, if you are not up to date on this vaccine.  Applying ice to the affected area.  Allergy medicines called antihistamines. This treatment may be needed if you develop itching or an allergic reaction to the insect bite.  Giving yourself an epinephrine injection if you have an anaphylactic reaction to a bite. To give the injection, you will use what is commonly called an auto-injector "pen" (pre-filled automatic epinephrine injection device). Your health care provider will teach you how to use an auto-injector pen. Follow these instructions at home: Bite area care   Do not scratch the bite area.  Keep  the bite area clean and dry. Wash it every day with soap and water as told by your health care provider.  Check the bite area every day for signs of infection. Check for: ? Redness, swelling, or pain. ? Fluid or blood. ? Warmth. ? Pus or a bad smell. Managing pain, itching, and swelling   You may apply cortisone cream, calamine lotion, or a paste made of baking soda and water to the bite area as told by your health care provider.  If directed, put ice on the bite area. ? Put ice in a plastic bag. ? Place a towel between your skin and the bag. ? Leave the ice on for 20 minutes, 2-3 times a day. General instructions  Apply or take over-the-counter and prescription medicines only as told by your health care  provider.  If you were prescribed an antibiotic medicine, take or apply it as told by your health care provider. Do not stop using the antibiotic even if your condition improves.  Keep all follow-up visits as told by your health care provider. This is important. How is this prevented? To help reduce your risk of insect bites:  When you are outdoors, wear clothing that covers your arms and legs. This is especially important in the early morning and evening.  Use insect repellent. The best insect repellents contain DEET, picaridin, oil of lemon eucalyptus (OLE), or IR3535.  Consider spraying your clothing with a pesticide called permethrin. Permethrin helps prevent insect bites. It works for several weeks and for up to 5-6 clothing washes. Do not apply permethrin directly to the skin.  If your home windows do not have screens, consider installing them.  If you will be sleeping in an area where there are mosquitoes, consider covering your sleeping area with a mosquito net. Contact a health care provider if:  You have redness, swelling, or pain in the bite area.  You have fluid or blood coming from the bite area.  The bite area feels warm to the touch.  You have pus or a bad smell coming from the bite area.  You have a fever. Get help right away if:  You have joint pain.  You have a rash.  You feel unusually tired or sleepy.  You have neck pain.  You have a headache.  You have unusual weakness.  You develop symptoms of an anaphylactic reaction. These may include: ? Flushed skin. ? Hives. ? Swelling of the eyes, lips, face, mouth, tongue, or throat. ? Difficulty breathing, speaking, or swallowing. ? Wheezing. ? Dizziness or light-headedness. ? Fainting. ? Pain or cramping in the abdomen. ? Vomiting. ? Diarrhea. These symptoms may represent a serious problem that is an emergency. Do not wait to see if the symptoms will go away. Do the following right away:  Use the  auto-injector pen as you have been instructed.  Get medical help. Call your local emergency services (911 in the [place] Do not drive yourself to the hospital. Summary  An insect bite can make your skin red, itchy, and swollen.  Treatment is usually not needed. Symptoms often go away on their own. When treatment is recommended, it may involve taking medicine, applying medicine to the area, or applying ice.  Apply or take over-the-counter and prescription medicines only as told by your health care provider.  Use insect repellent to help prevent insect bites.  Contact a health care provider if you have any signs of infection in the bite area. This information is not  intended to replace advice given to you by your health care provider. Make sure you discuss any questions you have with your health care provider. Document Released: 06/04/2004 Document Revised: 11/05/2017 Document Reviewed: 11/05/2017 Elsevier Interactive Patient Education  2019 [employer].

## 2018-10-13 NOTE — Progress Notes (Signed)
Subjective:    Patient ID: Monica Wyatt, female    DOB: 06-Dec-1955, 63 y.o.   MRN: [id number]  62y/o Caucasian established female pt c/o chigger bites to BLE. Occurred 2 weeks ago and believes at least one is becoming infected. Site has spread to the approx size of a quarter left lower leg and noticed 2 smaller red spots posterior right lower leg (dime sized). Used fingernail polish on sites at home. Has also used witch hazel, cortisone-10 cream, and calamine lotion at home, more so for healing purposes than itching. Sites are only mildly itchy, non-painful.  Denied drainage, fever, chills, leg swelling.     Review of Systems  Constitutional: Negative for activity change, appetite change, chills, diaphoresis, fatigue and fever.  HENT: Negative for trouble swallowing and voice change.   Eyes: Negative for photophobia and visual disturbance.  Respiratory: Negative for cough, shortness of breath, wheezing and stridor.   Cardiovascular: Negative for leg swelling.  Gastrointestinal: Negative for diarrhea, nausea and vomiting.  Endocrine: Negative for cold intolerance and heat intolerance.  Genitourinary: Negative for difficulty urinating.  Musculoskeletal: Negative for gait problem, joint swelling, myalgias, neck pain and neck stiffness.  Skin: Positive for color change and rash. Negative for pallor and wound.  Allergic/Immunologic: Negative for food allergies.  Neurological: Negative for dizziness, tremors, seizures, syncope, facial asymmetry, speech difficulty, weakness, light-headedness, numbness and headaches.  Hematological: Negative for adenopathy. Does not bruise/bleed easily.  Psychiatric/Behavioral: Negative for agitation, confusion and sleep disturbance.       Objective:   Physical Exam Vitals signs and nursing note reviewed.  Constitutional:      General: She is awake. She is not in acute distress.    Appearance: Normal appearance. She is well-developed and well-groomed.  She is not ill-appearing, toxic-appearing or diaphoretic.  HENT:     Head: Normocephalic and atraumatic.     Right Ear: External ear normal.     Left Ear: External ear normal.     Nose: Nose normal.     Mouth/Throat:     Mouth: Mucous membranes are moist.  Eyes:     General: Lids are normal. No visual field deficit or scleral icterus.       Right eye: No discharge.        Left eye: No discharge.     Extraocular Movements: Extraocular movements intact.     Conjunctiva/sclera: Conjunctivae normal.     Pupils: Pupils are equal, round, and reactive to light.  Neck:     Musculoskeletal: Normal range of motion and neck supple. No neck rigidity or muscular tenderness.     Trachea: Trachea normal.  Cardiovascular:     Rate and Rhythm: Normal rate and regular rhythm.     Pulses: Normal pulses.     Heart sounds: Normal heart sounds.  Pulmonary:     Effort: Pulmonary effort is normal. No respiratory distress.     Breath sounds: Normal breath sounds. No stridor. No wheezing, rhonchi or rales.  Abdominal:     Palpations: Abdomen is soft.  Musculoskeletal: Normal range of motion.        General: No swelling, tenderness, deformity or signs of injury.     Right lower leg: No edema.     Left lower leg: No edema.  Lymphadenopathy:     Cervical: No cervical adenopathy.  Skin:    General: Skin is warm and dry.     Capillary Refill: Capillary refill takes less than 2 seconds.  Coloration: Skin is not ashen, cyanotic, jaundiced, mottled, pale or sallow.     Findings: Erythema and rash present. No abrasion, abscess, acne, bruising, burn, ecchymosis, laceration, lesion, petechiae or wound. Rash is macular and papular. Rash is not crusting, nodular, purpuric, pustular, scaling, urticarial or vesicular.     Nails: There is no clubbing.        Neurological:     General: No focal deficit present.     Mental Status: She is alert and oriented to person, place, and time. Mental status is at baseline.      GCS: GCS eye subscore is 4. GCS verbal subscore is 5. GCS motor subscore is 6.     Cranial Nerves: Cranial nerves are intact. No cranial nerve deficit, dysarthria or facial asymmetry.     Sensory: Sensation is intact. No sensory deficit.     Motor: Motor function is intact. No weakness, tremor, atrophy, abnormal muscle tone or seizure activity.     Coordination: Coordination is intact. Coordination normal.     Gait: Gait is intact. Gait normal.     Comments: Gait sure and steady in hallway; strength upper and lower extremities equal 5/5; in/out of chair without difficulty  Psychiatric:        Mood and Affect: Mood normal.        Speech: Speech normal.        Behavior: Behavior normal. Behavior is cooperative.        Thought Content: Thought content normal.        Judgment: Judgment normal.       Unchanged after hydrocortisone application still erythema macular at 1340 today.  Will apply calagel to see if any change.  Patient verbalized understanding information/instructions, agreed with plan of care and had no further questions at this time.  Re-evaluation at 1405 at her worksite central erythema decreased after application calagel.  Discussed with patient to continue calagel application topical BID-QID.  Avoid scratching.  Hold bactrim DS unless increased temperature/swelling/pain/purulent discharge then start.  May apply hydrocortisone 1% topical BID prn itching.  Follow up with provider this weekend if red streaks to knee or foot.  Patient verbalized understanding information/instructions, agreed with plan of care and had no further questions at this time.    Assessment & Plan:  A-insect bite, initial visit  P-Exitcare handout on skin infection and insect bite. RTC if worsening erythema, pain, purulent discharge, fever. Wash towels, washcloths, sheets in hot water with bleach every couple of days until infection resolved. Wash area with soap and water at least daily.  Apply  hydrocortisone 1% lotion BID to affected areas may alternate with calagel topical BID prn itching and/or apply ice 15 minutes QID prn swelling/itching.  Avoid scratching as can lead to open wound/infection.  Has bactrim DS at home left over from [facility] dispense for UTI Mar 2020 discussed if redness spreading, pain, swelling, purulent discharge, hot to touch start bactrim DS po BID x 7 days.  Follow up on Monday with RN Hildred Alamin for re-evaluation; ER or UC/PCM if redness extends to knee or foot for re-evaluation.   Patient verbalized understanding, agreed with plan of care and had no further questions at this time.

## 2019-01-10 LAB — HM MAMMOGRAPHY

## 2019-02-13 ENCOUNTER — Other Ambulatory Visit: Payer: Self-pay | Admitting: Adult Health

## 2019-05-10 ENCOUNTER — Ambulatory Visit (INDEPENDENT_AMBULATORY_CARE_PROVIDER_SITE_OTHER): Payer: PRIVATE HEALTH INSURANCE | Admitting: Adult Health

## 2019-05-10 ENCOUNTER — Encounter: Payer: Self-pay | Admitting: Adult Health

## 2019-05-10 ENCOUNTER — Other Ambulatory Visit: Payer: Self-pay

## 2019-05-10 VITALS — BP 99/63 | HR 67 | Temp 98.8°F | Ht 64.25 in | Wt 125.5 lb

## 2019-05-10 DIAGNOSIS — R7303 Prediabetes: Secondary | ICD-10-CM | POA: Diagnosis not present

## 2019-05-10 DIAGNOSIS — Z0000 Encounter for general adult medical examination without abnormal findings: Secondary | ICD-10-CM

## 2019-05-10 DIAGNOSIS — E785 Hyperlipidemia, unspecified: Secondary | ICD-10-CM | POA: Diagnosis not present

## 2019-05-10 DIAGNOSIS — E559 Vitamin D deficiency, unspecified: Secondary | ICD-10-CM | POA: Diagnosis not present

## 2019-05-10 NOTE — Progress Notes (Signed)
Subjective:    Patient ID: Monica Wyatt, female    DOB: 1955-08-17, 63 y.o.   MRN: [id number]  HPI:  01/20/18 OV: Monica Wyatt is here for CPE. She reports medication compliance, denies SE She has been trying to reduce CHO intake  She denies formal exercise, however remains active with house/yard work and FT position at [employer] She estimates to drink >50 oz water/day and continues to abstain from ETOH/tobacco She denies CP/dyspnea/dizziness/HA/dizziness/palpitations  Healthcare Maintenance: PAP-UTD, completed with OB/GYN Mammogram-UTD, completed with OB/GYN Immunizations-UTD Colorguard- Negative  07/20/2018 OV: Monica Wyatt is here for 6 month f/u: HLD, Pre-Diabetes BP and weight are stable She reports enjoying the occasional sweet and remains active with "fast walking" at work M-F She denies CP/dyspnea/dizziness/palpitations She continues to abstain from tobacco/vape/ETOH use She had DEXA scan last week, has been on Aldendronate (Fosamax) 70mg  since 2017- denies SE Fasting labs obtained today   12/302020 OV: Monica Wyatt is here for CPE 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 She estimates to drink 20-30 oz water/day, has been trying to increase. She continues to enjoy sugary snacks most evenings of the week.  Fasting labs obtained today  Healthcare Maintenance: PAP-per pt last 01/2019- normal- tracking down Mammogram-UTD, last 01/10/2019- normal Immunizations-declined influenza  Colonoscopy-pt declined- had cologuard completed- tracking down report Patient Care Team    Relationship Specialty Notifications Start End  Esaw Grandchild, NP PCP - General Family Medicine  06/08/16   [name], MD Consulting Physician Dermatology  06/08/16   Aloha Gell, MD Consulting Physician Obstetrics and Gynecology  06/08/16     Patient Active Problem List   Diagnosis Date Noted  . Healthcare maintenance 10/14/2017   . Insomnia 10/14/2017  . Pre-diabetes 10/14/2017  . Screening for colon cancer 10/14/2017  . Osteoporosis 03/18/2017  . Hyperlipidemia 06/08/2016  . Chronic tension-type headache, not intractable 06/08/2016     Past Medical History:  Diagnosis Date  . Headache(784.0)   . Hyperlipidemia   . Medical history non-contributory   . Osteoporosis      Past Surgical History:  Procedure Laterality Date  . HERNIA REPAIR    . HYSTEROSCOPY WITH D & C N/A 07/22/2012   Procedure: DILATATION AND CURETTAGE /HYSTEROSCOPY;  Surgeon: [name]. [name], MD;  Location: [facility];  Service: Gynecology;  Laterality: N/A;  . LAPAROSCOPY N/A 07/22/2012   Procedure: LAPAROSCOPY OPERATIVE;  Surgeon: [name] [name], MD;  Location: [facility];  Service: Gynecology;  Laterality: N/A;  . NO PAST SURGERIES    . SALPINGOOPHORECTOMY Bilateral 07/22/2012   Procedure: SALPINGO OOPHORECTOMY;  Surgeon: [name]. [name], MD;  Location: [facility];  Service: Gynecology;  Laterality: Bilateral;     Family History  Problem Relation Age of Onset  . Dementia Mother   . COPD Father   . Healthy Sister   . Alcohol abuse Brother   . Sleep apnea Son   . Healthy Sister      Social History   Substance and Sexual Activity  Drug Use No     Social History   Substance and Sexual Activity  Alcohol Use No     Social History   Tobacco Use  Smoking Status Never Smoker  Smokeless Tobacco Never Used     Outpatient Encounter Medications as of 05/10/2019  Medication Sig  . alendronate (FOSAMAX) 70 MG tablet Take 1 tablet by mouth once a week.  [name] atorvastatin (LIPITOR) 20 MG tablet TAKE 1 TABLET BY  MOUTH DAILY.  . calcium elemental as carbonate (BARIATRIC TUMS ULTRA) 400 MG chewable tablet Chew 2.5 tablets by mouth as needed every 8 hours as needed for heartburn  . Cholecalciferol (VITAMIN D3) 2000 units TABS Take 1 tablet by mouth daily.  [name] ibuprofen (ADVIL) 200 MG tablet Take 3 tablets (600 mg total) by mouth every 6  (six) hours as needed for pain.  . metroNIDAZOLE (METROGEL) 1 % gel   . [DISCONTINUED] hydrocortisone 1 % lotion Apply 1 application topically 2 (two) times daily.  . [DISCONTINUED] sulfamethoxazole-trimethoprim (BACTRIM DS) 800-160 MG tablet Take 1 tablet by mouth 2 (two) times daily.   No facility-administered encounter medications on file as of 05/10/2019.    Allergies: Penicillins  Body mass index is 21.37 kg/m.  Blood pressure 99/63, pulse 67, temperature 98.8 F (37.1 C), temperature source Oral, height 5' 4.25" (1.632 m), weight 125 lb 8 oz (56.9 kg), SpO2 98 %.     Review of Systems  Constitutional: Positive for fatigue. Negative for activity change, appetite change, chills, diaphoresis, fever and unexpected weight change.  HENT: Negative for congestion.   Eyes: Negative for visual disturbance.  Respiratory: Negative for cough, chest tightness, shortness of breath and wheezing.   Cardiovascular: Negative for chest pain, palpitations and leg swelling.  Gastrointestinal: Negative for abdominal distention, abdominal pain, blood in stool, constipation, diarrhea, nausea and vomiting.  Endocrine: Negative for polydipsia, polyphagia and polyuria.  Genitourinary: Negative for difficulty urinating and flank pain.  Musculoskeletal: Negative for arthralgias, back pain, gait problem, joint swelling, myalgias, neck pain and neck stiffness.  Neurological: Negative for dizziness and headaches.  Hematological: Negative for adenopathy. Does not bruise/bleed easily.  Psychiatric/Behavioral: Negative for agitation, behavioral problems, confusion, decreased concentration, dysphoric mood, hallucinations, self-injury, sleep disturbance and suicidal ideas. The patient is not nervous/anxious and is not hyperactive.        Objective:   Physical Exam Vitals and nursing note reviewed.  Constitutional:      General: She is not in acute distress.    Appearance: Normal appearance. She is normal  weight. She is not ill-appearing, toxic-appearing or diaphoretic.  HENT:     Head: Normocephalic and atraumatic.     Right Ear: Tympanic membrane, ear canal and external ear normal. There is no impacted cerumen.     Left Ear: Tympanic membrane, ear canal and external ear normal. There is no impacted cerumen.     Nose: Nose normal. No congestion.     Mouth/Throat:     Mouth: Mucous membranes are moist.     Pharynx: No oropharyngeal exudate.  Eyes:     Extraocular Movements: Extraocular movements intact.     Conjunctiva/sclera: Conjunctivae normal.     Pupils: Pupils are equal, round, and reactive to light.  Cardiovascular:     Rate and Rhythm: Normal rate and regular rhythm.     Pulses: Normal pulses.     Heart sounds: Normal heart sounds. No murmur. No friction rub. No gallop.   Pulmonary:     Effort: Pulmonary effort is normal. No respiratory distress.     Breath sounds: Normal breath sounds. No stridor. No wheezing, rhonchi or rales.  Chest:     Chest wall: No tenderness.  Abdominal:     General: Abdomen is flat. Bowel sounds are normal. There is no distension.     Palpations: Abdomen is soft. There is no mass.     Tenderness: There is no abdominal tenderness. There is no right CVA tenderness, left CVA tenderness,  guarding or rebound.     Hernia: No hernia is present.  Musculoskeletal:        General: No tenderness. Normal range of motion.     Cervical back: Normal range of motion and neck supple.     Right lower leg: No edema.     Left lower leg: No edema.  Skin:    General: Skin is warm and dry.     Capillary Refill: Capillary refill takes less than 2 seconds.     Findings: No erythema.  Neurological:     Mental Status: She is alert and oriented to person, place, and time.     Coordination: Coordination normal.  Psychiatric:        Mood and Affect: Mood normal.        Thought Content: Thought content normal.        Judgment: Judgment normal.       Assessment & Plan:    1. Hyperlipidemia, unspecified hyperlipidemia type   2. Healthcare maintenance   3. Pre-diabetes   4. Vitamin D deficiency     Healthcare maintenance We will call you when lab results are available. Increase water intake, follow Mediterranean diet, reduce night time sugary snacks. Remain as active as possible. Complete stool cards and return to office. We are tracking down recent PAP and Colorguard results. Continue to social distance and wear a mask when in public. Follow-up next year- physical with fasting labs.  Hyperlipidemia Lipid Panel drawn Currently on Atorvastatin 20mg  QD   Pre-diabetes Lab Results  Component Value Date   HGBA1C 5.8 (A) 07/20/2018   HGBA1C 5.8 (H) 01/20/2018   HGBA1C 5.8 (H) 08/17/2017  A1c drawn today    FOLLOW-UP:  Return in about 1 year (around 05/09/2020) for CPE, Fasting Labs.

## 2019-05-10 NOTE — Assessment & Plan Note (Signed)
We will call you when lab results are available. Increase water intake, follow Mediterranean diet, reduce night time sugary snacks. Remain as active as possible. Complete stool cards and return to office. We are tracking down recent PAP and Colorguard results. Continue to social distance and wear a mask when in public. Follow-up next year- physical with fasting labs.

## 2019-05-10 NOTE — Patient Instructions (Signed)
Preventive Care for Adults, Female  A healthy lifestyle and preventive care can promote health and wellness. Preventive health guidelines for women include the following key practices.   A routine yearly physical is a good way to check with your health care provider about your health and preventive screening. It is a chance to share any concerns and updates on your health and to receive a thorough exam.   Visit your dentist for a routine exam and preventive care every 6 months. Brush your teeth twice a day and floss once a day. Good oral hygiene prevents tooth decay and gum disease.   The frequency of eye exams is based on your age, health, family medical history, use of contact lenses, and other factors. Follow your health care provider's recommendations for frequency of eye exams.   Eat a healthy diet. Foods like vegetables, fruits, whole grains, low-fat dairy products, and lean protein foods contain the nutrients you need without too many calories. Decrease your intake of foods high in solid fats, added sugars, and salt. Eat the right amount of calories for you.Get information about a proper diet from your health care provider, if necessary.   Regular physical exercise is one of the most important things you can do for your health. Most adults should get at least 150 minutes of moderate-intensity exercise (any activity that increases your heart rate and causes you to sweat) each week. In addition, most adults need muscle-strengthening exercises on 2 or more days a week.   Maintain a healthy weight. The body mass index (BMI) is a screening tool to identify possible weight problems. It provides an estimate of body fat based on height and weight. Your health care provider can find your BMI, and can help you achieve or maintain a healthy weight.For adults 20 years and older:   - A BMI below 18.5 is considered underweight.   - A BMI of 18.5 to 24.9 is normal.   - A BMI of 25 to 29.9 is  considered overweight.   - A BMI of 30 and above is considered obese.   Maintain normal blood lipids and cholesterol levels by exercising and minimizing your intake of trans and saturated fats.  Eat a balanced diet with plenty of fruit and vegetables. Blood tests for lipids and cholesterol should begin at age 72 and be repeated every 5 years minimum.  If your lipid or cholesterol levels are high, you are over 40, or you are at high risk for heart disease, you may need your cholesterol levels checked more frequently.Ongoing high lipid and cholesterol levels should be treated with medicines if diet and exercise are not working.   If you smoke, find out from your health care provider how to quit. If you do not use tobacco, do not start.   Lung cancer screening is recommended for adults aged 75-80 years who are at high risk for developing lung cancer because of a history of smoking. A yearly low-dose CT scan of the lungs is recommended for people who have at least a 30-pack-year history of smoking and are a current smoker or have quit within the past 15 years. A pack year of smoking is smoking an average of 1 pack of cigarettes a day for 1 year (for example: 1 pack a day for 30 years or 2 packs a day for 15 years). Yearly screening should continue until the smoker has stopped smoking for at least 15 years. Yearly screening should be stopped for people who develop a  health problem that would prevent them from having lung cancer treatment.   If you are pregnant, do not drink alcohol. If you are breastfeeding, be very cautious about drinking alcohol. If you are not pregnant and choose to drink alcohol, do not have more than 1 drink per day. One drink is considered to be 12 ounces (355 mL) of beer, 5 ounces (148 mL) of wine, or 1.5 ounces (44 mL) of liquor.   Avoid use of street drugs. Do not share needles with anyone. Ask for help if you need support or instructions about stopping the use of  drugs.   High blood pressure causes heart disease and increases the risk of stroke. Your blood pressure should be checked at least yearly.  Ongoing high blood pressure should be treated with medicines if weight loss and exercise do not work.   If you are 69-55 years old, ask your health care provider if you should take aspirin to prevent strokes.   Diabetes screening involves taking a blood sample to check your fasting blood sugar level. This should be done once every 3 years, after age 38, if you are within normal weight and without risk factors for diabetes. Testing should be considered at a younger age or be carried out more frequently if you are overweight and have at least 1 risk factor for diabetes.   Breast cancer screening is essential preventive care for women. You should practice "breast self-awareness."  This means understanding the normal appearance and feel of your breasts and may include breast self-examination.  Any changes detected, no matter how small, should be reported to a health care provider.  Women in their 80s and 30s should have a clinical breast exam (CBE) by a health care provider as part of a regular health exam every 1 to 3 years.  After age 66, women should have a CBE every year.  Starting at age 1, women should consider having a mammogram (breast X-ray test) every year.  Women who have a family history of breast cancer should talk to their health care provider about genetic screening.  Women at a high risk of breast cancer should talk to their health care providers about having an MRI and a mammogram every year.   -Breast cancer gene (BRCA)-related cancer risk assessment is recommended for women who have family members with BRCA-related cancers. BRCA-related cancers include breast, ovarian, tubal, and peritoneal cancers. Having family members with these cancers may be associated with an increased risk for harmful changes (mutations) in the breast cancer genes BRCA1 and  BRCA2. Results of the assessment will determine the need for genetic counseling and BRCA1 and BRCA2 testing.   The Pap test is a screening test for cervical cancer. A Pap test can show cell changes on the cervix that might become cervical cancer if left untreated. A Pap test is a procedure in which cells are obtained and examined from the lower end of the uterus (cervix).   - Women should have a Pap test starting at age 57.   - Between ages 90 and 70, Pap tests should be repeated every 2 years.   - Beginning at age 63, you should have a Pap test every 3 years as long as the past 3 Pap tests have been normal.   - Some women have medical problems that increase the chance of getting cervical cancer. Talk to your health care provider about these problems. It is especially important to talk to your health care provider if a  new problem develops soon after your last Pap test. In these cases, your health care provider may recommend more frequent screening and Pap tests.   - The above recommendations are the same for women who have or have not gotten the vaccine for human papillomavirus (HPV).   - If you had a hysterectomy for a problem that was not cancer or a condition that could lead to cancer, then you no longer need Pap tests. Even if you no longer need a Pap test, a regular exam is a good idea to make sure no other problems are starting.   - If you are between ages 36 and 66 years, and you have had normal Pap tests going back 10 years, you no longer need Pap tests. Even if you no longer need a Pap test, a regular exam is a good idea to make sure no other problems are starting.   - If you have had past treatment for cervical cancer or a condition that could lead to cancer, you need Pap tests and screening for cancer for at least 20 years after your treatment.   - If Pap tests have been discontinued, risk factors (such as a new sexual partner) need to be reassessed to determine if screening should  be resumed.   - The HPV test is an additional test that may be used for cervical cancer screening. The HPV test looks for the virus that can cause the cell changes on the cervix. The cells collected during the Pap test can be tested for HPV. The HPV test could be used to screen women aged 70 years and older, and should be used in women of any age who have unclear Pap test results. After the age of 67, women should have HPV testing at the same frequency as a Pap test.   Colorectal cancer can be detected and often prevented. Most routine colorectal cancer screening begins at the age of 57 years and continues through age 26 years. However, your health care provider may recommend screening at an earlier age if you have risk factors for colon cancer. On a yearly basis, your health care provider may provide home test kits to check for hidden blood in the stool.  Use of a small camera at the end of a tube, to directly examine the colon (sigmoidoscopy or colonoscopy), can detect the earliest forms of colorectal cancer. Talk to your health care provider about this at age 23, when routine screening begins. Direct exam of the colon should be repeated every 5 -10 years through age 49 years, unless early forms of pre-cancerous polyps or small growths are found.   People who are at an increased risk for hepatitis B should be screened for this virus. You are considered at high risk for hepatitis B if:  -You were born in a country where hepatitis B occurs often. Talk with your health care provider about which countries are considered high risk.  - Your parents were born in a high-risk country and you have not received a shot to protect against hepatitis B (hepatitis B vaccine).  - You have HIV or AIDS.  - You use needles to inject street drugs.  - You live with, or have sex with, someone who has Hepatitis B.  - You get hemodialysis treatment.  - You take certain medicines for conditions like cancer, organ  transplantation, and autoimmune conditions.   Hepatitis C blood testing is recommended for all people born from [birth year] through 1965 and any individual  with known risks for hepatitis C.   Practice safe sex. Use condoms and avoid high-risk sexual practices to reduce the spread of sexually transmitted infections (STIs). STIs include gonorrhea, chlamydia, syphilis, trichomonas, herpes, HPV, and human immunodeficiency virus (HIV). Herpes, HIV, and HPV are viral illnesses that have no cure. They can result in disability, cancer, and death. Sexually active women aged 25 years and younger should be checked for chlamydia. Older women with new or multiple partners should also be tested for chlamydia. Testing for other STIs is recommended if you are sexually active and at increased risk.   Osteoporosis is a disease in which the bones lose minerals and strength with aging. This can result in serious bone fractures or breaks. The risk of osteoporosis can be identified using a bone density scan. Women ages 65 years and over and women at risk for fractures or osteoporosis should discuss screening with their health care providers. Ask your health care provider whether you should take a calcium supplement or vitamin D to There are also several preventive steps women can take to avoid osteoporosis and resulting fractures or to keep osteoporosis from worsening. -->Recommendations include:  Eat a balanced diet high in fruits, vegetables, calcium, and vitamins.  Get enough calcium. The recommended total intake of is 1,200 mg daily; for best absorption, if taking supplements, divide doses into 250-500 mg doses throughout the day. Of the two types of calcium, calcium carbonate is best absorbed when taken with food but calcium citrate can be taken on an empty stomach.  Get enough vitamin D. NAMS and the National Osteoporosis Foundation recommend at least 1,000 IU per day for women age 50 and over who are at risk of vitamin D  deficiency. Vitamin D deficiency can be caused by inadequate sun exposure (for example, those who live in northern latitudes).  Avoid alcohol and smoking. Heavy alcohol intake (more than 7 drinks per week) increases the risk of falls and hip fracture and women smokers tend to lose bone more rapidly and have lower bone mass than nonsmokers. Stopping smoking is one of the most important changes women can make to improve their health and decrease risk for disease.  Be physically active every day. Weight-bearing exercise (for example, fast walking, hiking, jogging, and weight training) may strengthen bones or slow the rate of bone loss that comes with aging. Balancing and muscle-strengthening exercises can reduce the risk of falling and fracture.  Consider therapeutic medications. Currently, several types of effective drugs are available. Healthcare providers can recommend the type most appropriate for each woman.  Eliminate environmental factors that may contribute to accidents. Falls cause nearly 90% of all osteoporotic fractures, so reducing this risk is an important bone-health strategy. Measures include ample lighting, removing obstructions to walking, using nonskid rugs on floors, and placing mats and/or grab bars in showers.  Be aware of medication side effects. Some common medicines make bones weaker. These include a type of steroid drug called glucocorticoids used for arthritis and asthma, some antiseizure drugs, certain sleeping pills, treatments for endometriosis, and some cancer drugs. An overactive thyroid gland or using too much thyroid hormone for an underactive thyroid can also be a problem. If you are taking these medicines, talk to your doctor about what you can do to help protect your bones.reduce the rate of osteoporosis.    Menopause can be associated with physical symptoms and risks. Hormone replacement therapy is available to decrease symptoms and risks. You should talk to your  health care provider   about whether hormone replacement therapy is right for you.   Use sunscreen. Apply sunscreen liberally and repeatedly throughout the day. You should seek shade when your shadow is shorter than you. Protect yourself by wearing long sleeves, pants, a wide-brimmed hat, and sunglasses year round, whenever you are outdoors.   Once a month, do a whole body skin exam, using a mirror to look at the skin on your back. Tell your health care provider of new moles, moles that have irregular borders, moles that are larger than a pencil eraser, or moles that have changed in shape or color.   -Stay current with required vaccines (immunizations).   Influenza vaccine. All adults should be immunized every year.  Tetanus, diphtheria, and acellular pertussis (Td, Tdap) vaccine. Pregnant women should receive 1 dose of Tdap vaccine during each pregnancy. The dose should be obtained regardless of the length of time since the last dose. Immunization is preferred during the 27th 36th week of gestation. An adult who has not previously received Tdap or who does not know her vaccine status should receive 1 dose of Tdap. This initial dose should be followed by tetanus and diphtheria toxoids (Td) booster doses every 10 years. Adults with an unknown or incomplete history of completing a 3-dose immunization series with Td-containing vaccines should begin or complete a primary immunization series including a Tdap dose. Adults should receive a Td booster every 10 years.  Varicella vaccine. An adult without evidence of immunity to varicella should receive 2 doses or a second dose if she has previously received 1 dose. Pregnant females who do not have evidence of immunity should receive the first dose after pregnancy. This first dose should be obtained before leaving the health care facility. The second dose should be obtained 4 8 weeks after the first dose.  Human papillomavirus (HPV) vaccine. Females aged 45 26  years who have not received the vaccine previously should obtain the 3-dose series. The vaccine is not recommended for use in pregnant females. However, pregnancy testing is not needed before receiving a dose. If a female is found to be pregnant after receiving a dose, no treatment is needed. In that case, the remaining doses should be delayed until after the pregnancy. Immunization is recommended for any person with an immunocompromised condition through the age of 12 years if she did not get any or all doses earlier. During the 3-dose series, the second dose should be obtained 4 8 weeks after the first dose. The third dose should be obtained 24 weeks after the first dose and 16 weeks after the second dose.  Zoster vaccine. One dose is recommended for adults aged 29 years or older unless certain conditions are present.  Measles, mumps, and rubella (MMR) vaccine. Adults born before [birth year] generally are considered immune to measles and mumps. Adults born in [birth year] or later should have 1 or more doses of MMR vaccine unless there is a contraindication to the vaccine or there is laboratory evidence of immunity to each of the three diseases. A routine second dose of MMR vaccine should be obtained at least 28 days after the first dose for students attending postsecondary schools, health care workers, or international travelers. People who received inactivated measles vaccine or an unknown type of measles vaccine during 1963 1967 should receive 2 doses of MMR vaccine. People who received inactivated mumps vaccine or an unknown type of mumps vaccine before 1979 and are at high risk for mumps infection should consider immunization with 2 doses of  MMR vaccine. For females of childbearing age, rubella immunity should be determined. If there is no evidence of immunity, females who are not pregnant should be vaccinated. If there is no evidence of immunity, females who are pregnant should delay immunization until after pregnancy.  Unvaccinated health care workers born before [birth year] who lack laboratory evidence of measles, mumps, or rubella immunity or laboratory confirmation of disease should consider measles and mumps immunization with 2 doses of MMR vaccine or rubella immunization with 1 dose of MMR vaccine.  Pneumococcal 13-valent conjugate (PCV13) vaccine. When indicated, a person who is uncertain of her immunization history and has no record of immunization should receive the PCV13 vaccine. An adult aged 54 years or older who has certain medical conditions and has not been previously immunized should receive 1 dose of PCV13 vaccine. This PCV13 should be followed with a dose of pneumococcal polysaccharide (PPSV23) vaccine. The PPSV23 vaccine dose should be obtained at least 8 weeks after the dose of PCV13 vaccine. An adult aged 58 years or older who has certain medical conditions and previously received 1 or more doses of PPSV23 vaccine should receive 1 dose of PCV13. The PCV13 vaccine dose should be obtained 1 or more years after the last PPSV23 vaccine dose.  Pneumococcal polysaccharide (PPSV23) vaccine. When PCV13 is also indicated, PCV13 should be obtained first. All adults aged 58 years and older should be immunized. An adult younger than age 65 years who has certain medical conditions should be immunized. Any person who resides in a nursing home or long-term care facility should be immunized. An adult smoker should be immunized. People with an immunocompromised condition and certain other conditions should receive both PCV13 and PPSV23 vaccines. People with human immunodeficiency virus (HIV) infection should be immunized as soon as possible after diagnosis. Immunization during chemotherapy or radiation therapy should be avoided. Routine use of PPSV23 vaccine is not recommended for American Indians, [place] Natives, or people younger than 65 years unless there are medical conditions that require PPSV23 vaccine. When indicated,  people who have unknown immunization and have no record of immunization should receive PPSV23 vaccine. One-time revaccination 5 years after the first dose of PPSV23 is recommended for people aged 70 64 years who have chronic kidney failure, nephrotic syndrome, asplenia, or immunocompromised conditions. People who received 1 2 doses of PPSV23 before age 32 years should receive another dose of PPSV23 vaccine at age 96 years or later if at least 5 years have passed since the previous dose. Doses of PPSV23 are not needed for people immunized with PPSV23 at or after age 55 years.  Meningococcal vaccine. Adults with asplenia or persistent complement component deficiencies should receive 2 doses of quadrivalent meningococcal conjugate (MenACWY-D) vaccine. The doses should be obtained at least 2 months apart. Microbiologists working with certain meningococcal bacteria, [employer] recruits, people at risk during an outbreak, and people who travel to or live in countries with a high rate of meningitis should be immunized. A first-year college student up through age 58 years who is living in a residence hall should receive a dose if she did not receive a dose on or after her 16th birthday. Adults who have certain high-risk conditions should receive one or more doses of vaccine.  Hepatitis A vaccine. Adults who wish to be protected from this disease, have certain high-risk conditions, work with hepatitis A-infected animals, work in hepatitis A research labs, or travel to or work in countries with a high rate of hepatitis A should be  immunized. Adults who were previously unvaccinated and who anticipate close contact with an international adoptee during the first 60 days after arrival in the [place] from a country with a high rate of hepatitis A should be immunized.  Hepatitis B vaccine.  Adults who wish to be protected from this disease, have certain high-risk conditions, may be exposed to blood or other infectious  body fluids, are household contacts or sex partners of hepatitis B positive people, are clients or workers in certain care facilities, or travel to or work in countries with a high rate of hepatitis B should be immunized.  Haemophilus influenzae type b (Hib) vaccine. A previously unvaccinated person with asplenia or sickle cell disease or having a scheduled splenectomy should receive 1 dose of Hib vaccine. Regardless of previous immunization, a recipient of a hematopoietic stem cell transplant should receive a 3-dose series 6 12 months after her successful transplant. Hib vaccine is not recommended for adults with HIV infection.  Preventive Services / Frequency Ages 6 to 39years  Blood pressure check.** / Every 1 to 2 years.  Lipid and cholesterol check.** / Every 5 years beginning at age 39.  Clinical breast exam.** / Every 3 years for women in their 61s and 62s.  BRCA-related cancer risk assessment.** / For women who have family members with a BRCA-related cancer (breast, ovarian, tubal, or peritoneal cancers).  Pap test.** / Every 2 years from ages 47 through 85. Every 3 years starting at age 34 through age 12 or 74 with a history of 3 consecutive normal Pap tests.  HPV screening.** / Every 3 years from ages 46 through ages 43 to 54 with a history of 3 consecutive normal Pap tests.  Hepatitis C blood test.** / For any individual with known risks for hepatitis C.  Skin self-exam. / Monthly.  Influenza vaccine. / Every year.  Tetanus, diphtheria, and acellular pertussis (Tdap, Td) vaccine.** / Consult your health care provider. Pregnant women should receive 1 dose of Tdap vaccine during each pregnancy. 1 dose of Td every 10 years.  Varicella vaccine.** / Consult your health care provider. Pregnant females who do not have evidence of immunity should receive the first dose after pregnancy.  HPV vaccine. / 3 doses over 6 months, if 64 and younger. The vaccine is not recommended for use in  pregnant females. However, pregnancy testing is not needed before receiving a dose.  Measles, mumps, rubella (MMR) vaccine.** / You need at least 1 dose of MMR if you were born in 1957 or later. You may also need a 2nd dose. For females of childbearing age, rubella immunity should be determined. If there is no evidence of immunity, females who are not pregnant should be vaccinated. If there is no evidence of immunity, females who are pregnant should delay immunization until after pregnancy.  Pneumococcal 13-valent conjugate (PCV13) vaccine.** / Consult your health care provider.  Pneumococcal polysaccharide (PPSV23) vaccine.** / 1 to 2 doses if you smoke cigarettes or if you have certain conditions.  Meningococcal vaccine.** / 1 dose if you are age 71 to 37 years and a Market researcher living in a residence hall, or have one of several medical conditions, you need to get vaccinated against meningococcal disease. You may also need additional booster doses.  Hepatitis A vaccine.** / Consult your health care provider.  Hepatitis B vaccine.** / Consult your health care provider.  Haemophilus influenzae type b (Hib) vaccine.** / Consult your health care provider.  Ages 55 to 64years  Blood pressure check.** / Every 1 to 2 years.  Lipid and cholesterol check.** / Every 5 years beginning at age 20 years.  Lung cancer screening. / Every year if you are aged 55 80 years and have a 30-pack-year history of smoking and currently smoke or have quit within the past 15 years. Yearly screening is stopped once you have quit smoking for at least 15 years or develop a health problem that would prevent you from having lung cancer treatment.  Clinical breast exam.** / Every year after age 40 years.  BRCA-related cancer risk assessment.** / For women who have family members with a BRCA-related cancer (breast, ovarian, tubal, or peritoneal cancers).  Mammogram.** / Every year beginning at age 40  years and continuing for as long as you are in good health. Consult with your health care provider.  Pap test.** / Every 3 years starting at age 30 years through age 65 or 70 years with a history of 3 consecutive normal Pap tests.  HPV screening.** / Every 3 years from ages 30 years through ages 65 to 70 years with a history of 3 consecutive normal Pap tests.  Fecal occult blood test (FOBT) of stool. / Every year beginning at age 50 years and continuing until age 75 years. You may not need to do this test if you get a colonoscopy every 10 years.  Flexible sigmoidoscopy or colonoscopy.** / Every 5 years for a flexible sigmoidoscopy or every 10 years for a colonoscopy beginning at age 50 years and continuing until age 75 years.  Hepatitis C blood test.** / For all people born from 1945 through 1965 and any individual with known risks for hepatitis C.  Skin self-exam. / Monthly.  Influenza vaccine. / Every year.  Tetanus, diphtheria, and acellular pertussis (Tdap/Td) vaccine.** / Consult your health care provider. Pregnant women should receive 1 dose of Tdap vaccine during each pregnancy. 1 dose of Td every 10 years.  Varicella vaccine.** / Consult your health care provider. Pregnant females who do not have evidence of immunity should receive the first dose after pregnancy.  Zoster vaccine.** / 1 dose for adults aged 60 years or older.  Measles, mumps, rubella (MMR) vaccine.** / You need at least 1 dose of MMR if you were born in 1957 or later. You may also need a 2nd dose. For females of childbearing age, rubella immunity should be determined. If there is no evidence of immunity, females who are not pregnant should be vaccinated. If there is no evidence of immunity, females who are pregnant should delay immunization until after pregnancy.  Pneumococcal 13-valent conjugate (PCV13) vaccine.** / Consult your health care provider.  Pneumococcal polysaccharide (PPSV23) vaccine.** / 1 to 2 doses if  you smoke cigarettes or if you have certain conditions.  Meningococcal vaccine.** / Consult your health care provider.  Hepatitis A vaccine.** / Consult your health care provider.  Hepatitis B vaccine.** / Consult your health care provider.  Haemophilus influenzae type b (Hib) vaccine.** / Consult your health care provider.  Ages 65 years and over  Blood pressure check.** / Every 1 to 2 years.  Lipid and cholesterol check.** / Every 5 years beginning at age 20 years.  Lung cancer screening. / Every year if you are aged 55 80 years and have a 30-pack-year history of smoking and currently smoke or have quit within the past 15 years. Yearly screening is stopped once you have quit smoking for at least 15 years or develop a health problem that   would prevent you from having lung cancer treatment.  Clinical breast exam.** / Every year after age 1 years.  BRCA-related cancer risk assessment.** / For women who have family members with a BRCA-related cancer (breast, ovarian, tubal, or peritoneal cancers).  Mammogram.** / Every year beginning at age 65 years and continuing for as long as you are in good health. Consult with your health care provider.  Pap test.** / Every 3 years starting at age 31 years through age 56 or 89 years with 3 consecutive normal Pap tests. Testing can be stopped between 65 and 70 years with 3 consecutive normal Pap tests and no abnormal Pap or HPV tests in the past 10 years.  HPV screening.** / Every 3 years from ages 70 years through ages 55 or 55 years with a history of 3 consecutive normal Pap tests. Testing can be stopped between 65 and 70 years with 3 consecutive normal Pap tests and no abnormal Pap or HPV tests in the past 10 years.  Fecal occult blood test (FOBT) of stool. / Every year beginning at age 55 years and continuing until age 2 years. You may not need to do this test if you get a colonoscopy every 10 years.  Flexible sigmoidoscopy or colonoscopy.** /  Every 5 years for a flexible sigmoidoscopy or every 10 years for a colonoscopy beginning at age 65 years and continuing until age 49 years.  Hepatitis C blood test.** / For all people born from [birth year] through 1965 and any individual with known risks for hepatitis C.  Osteoporosis screening.** / A one-time screening for women ages 80 years and over and women at risk for fractures or osteoporosis.  Skin self-exam. / Monthly.  Influenza vaccine. / Every year.  Tetanus, diphtheria, and acellular pertussis (Tdap/Td) vaccine.** / 1 dose of Td every 10 years.  Varicella vaccine.** / Consult your health care provider.  Zoster vaccine.** / 1 dose for adults aged 74 years or older.  Pneumococcal 13-valent conjugate (PCV13) vaccine.** / Consult your health care provider.  Pneumococcal polysaccharide (PPSV23) vaccine.** / 1 dose for all adults aged 33 years and older.  Meningococcal vaccine.** / Consult your health care provider.  Hepatitis A vaccine.** / Consult your health care provider.  Hepatitis B vaccine.** / Consult your health care provider.  Haemophilus influenzae type b (Hib) vaccine.** / Consult your health care provider. ** Family history and personal history of risk and conditions may change your health care provider's recommendations. Document Released: 06/23/2001 Document Revised: 02/15/2013  [facility] Patient Information 2014 [place], [place].   EXERCISE AND DIET:  We recommended that you start or continue a regular exercise program for good health. Regular exercise means any activity that makes your heart beat faster and makes you sweat.  We recommend exercising at least 30 minutes per day at least 3 days a week, preferably 5.  We also recommend a diet low in fat and sugar / carbohydrates.  Inactivity, poor dietary choices and obesity can cause diabetes, heart attack, stroke, and kidney damage, among others.     ALCOHOL AND SMOKING:  Women should limit their alcohol intake to no  more than 7 drinks/beers/glasses of wine (combined, not each!) per week. Moderation of alcohol intake to this level decreases your risk of breast cancer and liver damage.  ( And of course, no recreational drugs are part of a healthy lifestyle.)  Also, you should not be smoking at all or even being exposed to second hand smoke. Most people know smoking can  cause cancer, and various heart and lung diseases, but did you know it also contributes to weakening of your bones?  Aging of your skin?  Yellowing of your teeth and nails?   CALCIUM AND VITAMIN D:  Adequate intake of calcium and Vitamin D are recommended.  The recommendations for exact amounts of these supplements seem to change often, but generally speaking 600 mg of calcium (either carbonate or citrate) and 800 units of Vitamin D per day seems prudent. Certain women may benefit from higher intake of Vitamin D.  If you are among these women, your doctor will have told you during your visit.     PAP SMEARS:  Pap smears, to check for cervical cancer or precancers,  have traditionally been done yearly, although recent scientific advances have shown that most women can have pap smears less often.  However, every woman still should have a physical exam from her gynecologist or primary care physician every year. It will include a breast check, inspection of the vulva and vagina to check for abnormal growths or skin changes, a visual exam of the cervix, and then an exam to evaluate the size and shape of the uterus and ovaries.  And after 62 years of age, a rectal exam is indicated to check for rectal cancers. We will also provide age appropriate advice regarding health maintenance, like when you should have certain vaccines, screening for sexually transmitted diseases, bone density testing, colonoscopy, mammograms, etc.    MAMMOGRAMS:  All women over 57 years old should have a yearly mammogram. Many facilities now offer a "3D" mammogram, which may cost  around $50 extra out of pocket. If possible,  we recommend you accept the option to have the 3D mammogram performed.  It both reduces the number of women who will be called back for extra views which then turn out to be normal, and it is better than the routine mammogram at detecting truly abnormal areas.     COLONOSCOPY:  Colonoscopy to screen for colon cancer is recommended for all women at age 23.  We know, you hate the idea of the prep.  We agree, BUT, having colon cancer and not knowing it is worse!!  Colon cancer so often starts as a polyp that can be seen and removed at colonscopy, which can quite literally save your life!  And if your first colonoscopy is normal and you have no family history of colon cancer, most women don't have to have it again for 10 years.  Once every ten years, you can do something that may end up saving your life, right?  We will be happy to help you get it scheduled when you are ready.  Be sure to check your insurance coverage so you understand how much it will cost.  It may be covered as a preventative service at no cost, but you should check your particular policy.    We will call you when lab results are available. Increase water intake, follow Mediterranean diet, reduce night time sugary snacks. Remain as active as possible. Complete stool cards and return to office. We are tracking down recent PAP and Colorguard results. Continue to social distance and wear a mask when in public. Follow-up next year- physical with fasting labs. GREAT TO SEE YOU!

## 2019-05-10 NOTE — Assessment & Plan Note (Signed)
Lipid Panel drawn Currently on Atorvastatin 20mg  QD

## 2019-05-10 NOTE — Assessment & Plan Note (Signed)
Lab Results  Component Value Date   HGBA1C 5.8 (A) 07/20/2018   HGBA1C 5.8 (H) 01/20/2018   HGBA1C 5.8 (H) 08/17/2017  A1c drawn today

## 2019-05-11 LAB — CBC WITH DIFFERENTIAL/PLATELET
Basophils Absolute: 0 10*3/uL (ref 0.0–0.2)
Basos: 1 %
EOS (ABSOLUTE): 0.1 10*3/uL (ref 0.0–0.4)
Eos: 2 %
Hematocrit: 39.1 % (ref 34.0–46.6)
Hemoglobin: 13.4 g/dL (ref 11.1–15.9)
Immature Grans (Abs): 0 10*3/uL (ref 0.0–0.1)
Immature Granulocytes: 0 %
Lymphocytes Absolute: 1.3 10*3/uL (ref 0.7–3.1)
Lymphs: 34 %
MCH: 31.6 pg (ref 26.6–33.0)
MCHC: 34.3 g/dL (ref 31.5–35.7)
MCV: 92 fL (ref 79–97)
Monocytes Absolute: 0.5 10*3/uL (ref 0.1–0.9)
Monocytes: 13 %
Neutrophils Absolute: 1.9 10*3/uL (ref 1.4–7.0)
Neutrophils: 50 %
Platelets: 233 10*3/uL (ref 150–450)
RBC: 4.24 x10E6/uL (ref 3.77–5.28)
RDW: 12.3 % (ref 11.7–15.4)
WBC: 3.7 10*3/uL (ref 3.4–10.8)

## 2019-05-11 LAB — VITAMIN D 25 HYDROXY (VIT D DEFICIENCY, FRACTURES): Vit D, 25-Hydroxy: 30.4 ng/mL (ref 30.0–100.0)

## 2019-05-11 LAB — COMPREHENSIVE METABOLIC PANEL
ALT: 20 IU/L (ref 0–32)
AST: 22 IU/L (ref 0–40)
Albumin/Globulin Ratio: 2 (ref 1.2–2.2)
Albumin: 4.6 g/dL (ref 3.8–4.8)
Alkaline Phosphatase: 66 IU/L (ref 39–117)
BUN/Creatinine Ratio: 23 (ref 12–28)
BUN: 21 mg/dL (ref 8–27)
Bilirubin Total: 0.5 mg/dL (ref 0.0–1.2)
CO2: 27 mmol/L (ref 20–29)
Calcium: 10.1 mg/dL (ref 8.7–10.3)
Chloride: 103 mmol/L (ref 96–106)
Creatinine, Ser: 0.92 mg/dL (ref 0.57–1.00)
GFR calc Af Amer: 77 mL/min/{1.73_m2} (ref >59)
GFR calc non Af Amer: 66 mL/min/{1.73_m2} (ref >59)
Globulin, Total: 2.3 g/dL (ref 1.5–4.5)
Glucose: 85 mg/dL (ref 65–99)
Potassium: 5.8 mmol/L — ABNORMAL HIGH (ref 3.5–5.2)
Sodium: 141 mmol/L (ref 134–144)
Total Protein: 6.9 g/dL (ref 6.0–8.5)

## 2019-05-11 LAB — LIPID PANEL
Chol/HDL Ratio: 2.8 ratio (ref 0.0–4.4)
Cholesterol, Total: 154 mg/dL (ref 100–199)
HDL: 56 mg/dL (ref >39)
LDL Chol Calc (NIH): 83 mg/dL (ref 0–99)
Triglycerides: 78 mg/dL (ref 0–149)
VLDL Cholesterol Cal: 15 mg/dL (ref 5–40)

## 2019-05-11 LAB — HEMOGLOBIN A1C
Est. average glucose Bld gHb Est-mCnc: 114 mg/dL
Hgb A1c MFr Bld: 5.6 % (ref 4.8–5.6)

## 2019-05-11 LAB — TSH: TSH: 3.67 u[IU]/mL (ref 0.450–4.500)

## 2019-05-13 ENCOUNTER — Other Ambulatory Visit: Payer: Self-pay | Admitting: Adult Health

## 2019-05-13 DIAGNOSIS — E875 Hyperkalemia: Secondary | ICD-10-CM

## 2019-05-22 ENCOUNTER — Other Ambulatory Visit: Payer: Self-pay | Admitting: Adult Health

## 2019-05-23 ENCOUNTER — Other Ambulatory Visit (INDEPENDENT_AMBULATORY_CARE_PROVIDER_SITE_OTHER): Payer: PRIVATE HEALTH INSURANCE

## 2019-05-23 ENCOUNTER — Other Ambulatory Visit: Payer: Self-pay

## 2019-05-23 DIAGNOSIS — Z1211 Encounter for screening for malignant neoplasm of colon: Secondary | ICD-10-CM | POA: Diagnosis not present

## 2019-05-23 LAB — IFOBT (OCCULT BLOOD)
IFOBT: NEGATIVE
IFOBT: NEGATIVE
IFOBT: NEGATIVE

## 2019-06-13 ENCOUNTER — Other Ambulatory Visit: Payer: Self-pay

## 2019-06-13 ENCOUNTER — Other Ambulatory Visit: Payer: PRIVATE HEALTH INSURANCE

## 2019-06-13 DIAGNOSIS — E875 Hyperkalemia: Secondary | ICD-10-CM

## 2019-06-14 LAB — COMPREHENSIVE METABOLIC PANEL
ALT: 17 IU/L (ref 0–32)
AST: 24 IU/L (ref 0–40)
Albumin/Globulin Ratio: 2.1 (ref 1.2–2.2)
Albumin: 4.5 g/dL (ref 3.8–4.8)
Alkaline Phosphatase: 61 IU/L (ref 39–117)
BUN/Creatinine Ratio: 17 (ref 12–28)
BUN: 16 mg/dL (ref 8–27)
Bilirubin Total: 0.5 mg/dL (ref 0.0–1.2)
CO2: 25 mmol/L (ref 20–29)
Calcium: 9.4 mg/dL (ref 8.7–10.3)
Chloride: 102 mmol/L (ref 96–106)
Creatinine, Ser: 0.95 mg/dL (ref 0.57–1.00)
GFR calc Af Amer: 74 mL/min/{1.73_m2} (ref >59)
GFR calc non Af Amer: 64 mL/min/{1.73_m2} (ref >59)
Globulin, Total: 2.1 g/dL (ref 1.5–4.5)
Glucose: 84 mg/dL (ref 65–99)
Potassium: 4.6 mmol/L (ref 3.5–5.2)
Sodium: 139 mmol/L (ref 134–144)
Total Protein: 6.6 g/dL (ref 6.0–8.5)

## 2019-06-20 ENCOUNTER — Encounter: Payer: Self-pay | Admitting: Adult Health

## 2019-08-18 ENCOUNTER — Other Ambulatory Visit: Payer: Self-pay | Admitting: Family Medicine

## 2019-08-22 ENCOUNTER — Other Ambulatory Visit: Payer: Self-pay

## 2019-08-22 ENCOUNTER — Ambulatory Visit: Payer: Self-pay | Admitting: Registered Nurse

## 2019-08-22 ENCOUNTER — Encounter: Payer: Self-pay | Admitting: Registered Nurse

## 2019-08-22 VITALS — BP 104/67 | HR 62 | Temp 98.3°F

## 2019-08-22 DIAGNOSIS — M545 Low back pain, unspecified: Secondary | ICD-10-CM

## 2019-08-22 DIAGNOSIS — S76012A Strain of muscle, fascia and tendon of left hip, initial encounter: Secondary | ICD-10-CM

## 2019-08-22 LAB — POCT URINALYSIS DIPSTICK
Bilirubin, UA: NEGATIVE
Glucose, UA: NEGATIVE mg/dL
Ketones, POC UA: NEGATIVE mg/dL
Leukocytes, UA: NEGATIVE
Nitrite, UA: NEGATIVE
Protein Ur, POC: NEGATIVE mg/dL
Specific Gravity, UA: 1.015 (ref 1.005–1.030)
Urobilinogen, UA: 0.2 E.U./dL
pH, UA: 7 (ref 5.0–8.0)

## 2019-08-22 MED ORDER — BIOFREEZE 4 % EX GEL: 1.0000 "application " | Freq: Four times a day (QID) | CUTANEOUS | Status: AC | PRN

## 2019-08-22 NOTE — Progress Notes (Signed)
Subjective:    Patient ID: Monica Wyatt, female    DOB: 1956-02-16, 64 y.o.   MRN: [id number]  63y/o Caucasian established female pt c/o lower Left back pain x4 days. Noticed it early in work shift Friday morning. Squatted down to bottom shelf and upon standing, felt tightnss in back. After sitting 61min at lunch break, "could barely make it back to clock in because of the pain." Over the weekend she tried to lift a gallon of milk but pain worsened when she tried to lift it.  Pain improving over past couple of days. Took ibuprofen at home for pain x1 dose on Friday evening. Also reports feeling urge to urinate but not having much output. Denies any other dysuria sx.  She tends to drink coffee not water and hasn't been drinking as much during these warmer temperature spring days and sweating more at work.  Last UTI approximately a year ago antibiotic use.  Has reusable ice pack at home for prn use.  Denied loss of bowel/bladder control, saddle paresthesias, arm/leg weakness, gross hematuria/flank pain, fever, chills, n/v/d or cloudy/smelly urine.      Review of Systems  Constitutional: Negative for activity change, appetite change, chills, diaphoresis, fatigue and fever.  HENT: Negative for trouble swallowing and voice change.   Eyes: Negative for photophobia and visual disturbance.  Respiratory: Negative for cough, shortness of breath, wheezing and stridor.   Cardiovascular: Negative for chest pain, palpitations and leg swelling.  Gastrointestinal: Negative for abdominal pain, diarrhea, nausea and vomiting.  Endocrine: Negative for cold intolerance and heat intolerance.  Genitourinary: Positive for decreased urine volume. Negative for difficulty urinating, flank pain and hematuria.  Musculoskeletal: Positive for back pain and myalgias. Negative for arthralgias, gait problem, joint swelling, neck pain and neck stiffness.  Skin: Negative for color change, pallor, rash and wound.   Allergic/Immunologic: Negative for environmental allergies and food allergies.  Neurological: Negative for dizziness, tremors, seizures, syncope, facial asymmetry, speech difficulty, weakness, light-headedness, numbness and headaches.  Hematological: Negative for adenopathy. Does not bruise/bleed easily.  Psychiatric/Behavioral: Negative for agitation, confusion and sleep disturbance.       Objective:   Physical Exam Vitals and nursing note reviewed.  Constitutional:      General: She is awake. She is not in acute distress.    Appearance: Normal appearance. She is well-developed, well-groomed and normal weight. She is not ill-appearing, toxic-appearing or diaphoretic.  HENT:     Head: Normocephalic and atraumatic.     Jaw: There is normal jaw occlusion.     Right Ear: Hearing and external ear normal.     Left Ear: Hearing and external ear normal.     Nose: Nose normal.     Mouth/Throat:     Pharynx: Oropharynx is clear.  Eyes:     General: Lids are normal. Vision grossly intact. Gaze aligned appropriately. No allergic shiner, visual field deficit or scleral icterus.       Right eye: No discharge.        Left eye: No discharge.     Extraocular Movements: Extraocular movements intact.     Conjunctiva/sclera: Conjunctivae normal.     Pupils: Pupils are equal, round, and reactive to light.  Neck:     Thyroid: No thyromegaly.     Vascular: No JVD.     Trachea: Trachea and phonation normal. No tracheal deviation.  Cardiovascular:     Rate and Rhythm: Normal rate and regular rhythm.     Pulses:  Radial pulses are 2+ on the right side and 2+ on the left side.     Heart sounds: Normal heart sounds. No murmur.  Pulmonary:     Effort: Pulmonary effort is normal. No respiratory distress.     Breath sounds: Normal breath sounds and air entry. No stridor, decreased air movement or transmitted upper airway sounds. No wheezing, rhonchi or rales.     Comments: Wearing cloth mask due  to covid 19 pandemic; spoke full sentences without difficulty; no cough observed in exam room Abdominal:     Palpations: Abdomen is soft.     Tenderness: There is no abdominal tenderness.  Musculoskeletal:        General: Tenderness present. No deformity. Normal range of motion.     Right shoulder: Normal.     Left shoulder: Normal.     Right elbow: Normal.     Left elbow: Normal.     Right hand: Normal.     Left hand: Normal.     Cervical back: Normal, normal range of motion and neck supple. No swelling, edema, deformity, signs of trauma, lacerations, rigidity, spasms, torticollis, tenderness, bony tenderness or crepitus. No pain with movement, spinous process tenderness or muscular tenderness. Normal range of motion.     Thoracic back: Normal. No swelling, edema, deformity, signs of trauma, lacerations, spasms, tenderness or bony tenderness. Normal range of motion. No scoliosis.     Lumbar back: Tenderness present. No swelling, edema, deformity, signs of trauma, lacerations, spasms or bony tenderness. Normal range of motion. No scoliosis.       Back:     Right hip: Normal.     Left hip: Tenderness present. No deformity, lacerations, bony tenderness or crepitus. Normal range of motion. Normal strength.     Right upper leg: Normal.     Left upper leg: Tenderness present. No swelling, edema, deformity, lacerations or bony tenderness.     Right knee: Normal.     Left knee: Normal.     Right lower leg: Normal.     Left lower leg: Normal.     Right ankle: Normal.     Left ankle: Normal.     Comments: Pain with palpation left gluteus maximus/,medius and left paraspinal lateral L1-2; able to touch shins; pain worsens with lateral bending and rotation; extension lumbar no change in pain; bilateral CVA nontender  Lymphadenopathy:     Head:     Right side of head: No submental, submandibular or preauricular adenopathy.     Left side of head: No submental, submandibular or preauricular  adenopathy.     Cervical: No cervical adenopathy.     Right cervical: No superficial cervical adenopathy.    Left cervical: No superficial cervical adenopathy.  Skin:    General: Skin is warm and dry.     Capillary Refill: Capillary refill takes less than 2 seconds.     Coloration: Skin is not ashen, cyanotic, jaundiced, mottled, pale or sallow.     Findings: No abrasion, abscess, acne, bruising, burn, ecchymosis, erythema, signs of injury, laceration, lesion, petechiae, rash or wound.     Nails: There is no clubbing.  Neurological:     Mental Status: She is alert and oriented to person, place, and time. She is not disoriented.     GCS: GCS eye subscore is 4. GCS verbal subscore is 5. GCS motor subscore is 6.     Cranial Nerves: Cranial nerves are intact. No cranial nerve deficit, dysarthria or facial asymmetry.  Sensory: Sensation is intact. No sensory deficit.     Motor: Motor function is intact. No weakness, tremor, atrophy, abnormal muscle tone or seizure activity.     Coordination: Coordination is intact. Coordination normal.     Gait: Gait is intact. Gait normal.     Deep Tendon Reflexes: Reflexes normal.     Reflex Scores:      Brachioradialis reflexes are 2+ on the right side and 2+ on the left side.      Patellar reflexes are 2+ on the right side and 2+ on the left side.    Comments: Strength 5/5 bilaterally arms/legs; bilateral hand grasp equal; on/off exam table and in/out of chair without difficulty; gait sure and steady in clinic  Psychiatric:        Attention and Perception: Attention and perception normal. She is attentive.        Mood and Affect: Mood and affect normal.        Speech: Speech normal.        Behavior: Behavior normal. Behavior is cooperative.        Thought Content: Thought content normal.        Cognition and Memory: Cognition and memory normal.        Judgment: Judgment normal.       Discussed urinalysis results verbally with patient no  infection but chronic moderate RBCs noted.  If gross hematuria or cloudy/smelly urine to notify clinic staff and will repeat dipstick ua.  Patient verbalized understanding information/instructions, agreed with plan of care and had no further questions at this time.      Assessment & Plan:  A-acute left sided lumbar strain and left gluteus medius/maximus strain initial visit  P-cyclobenazeprine/flexeril 10mg  may take 1/2-1 po qhs prn muscle spasms #30 RF0 dispensed from [facility].  Has OTC at home may take up to Ibuprofen 800mg  po TID prn pain  Take with food to avoid stomach upset.  Avoid alcohol intake and driving while taking cyclobenazeprine/flexeril as drowsiness common side effect.  Slow position changes as medication also lower blood pressure.  Home stretches demonstrated to patient-e.g. hip arom/lumbar arom, knee to chest and rock side to side on back. Self massage or professional prn, foam roller use or tennis/racquetball.  Heat/cryotherapy 15 minutes QID prn.  Trial thermacare 1 applied and another given to patient for use tomorrow from clinic stock.  biofreeze gel may apply QID prn pain.  Given 4 ud from clinic stock.  Consider physical therapy referral if no improvement with prescribed therapy from [facility] and/or chiropractic care.  Ensure ergonomics correct desk at work avoid repetitive motions if possible/holding phone/laptop in hand use desk/stand and/or break up lifting items into smaller loads/weights.  Patient was instructed to rest, ice, and ROM exercises.  Activity as tolerated.  Avoid prolonged static positions.  Stretch hourly.   Follow up if symptoms persist or worsen especially if loss of bowel/bladder control, arm/leg weakness and/or saddle paresthesias.  Exitcare handout on gluteus syndrom and rehab exercises given to patient.  Patient verbalized agreement and understanding of treatment plan and had no further questions at this time.  P2:  Injury Prevention and Fitness.

## 2019-08-22 NOTE — Patient Instructions (Signed)
Gluteus Medius Syndrome Rehab Ask your health care provider which exercises are safe for you. Do exercises exactly as told by your health care provider and adjust them as directed. It is normal to feel mild stretching, pulling, tightness, or discomfort as you do these exercises. Stop right away if you feel sudden pain or your pain gets worse. Do not begin these exercises until told by your health care provider. Stretching and range-of-motion exercise This exercise warms up your muscles and joints and improves the movement and flexibility of your hip and pelvis. This exercise also helps to relieve muscle and joint pain and stiffness. Lunge This exercise is also called hip flexor stretch. 1. Kneel on the floor on your left / right knee. Bend your other knee so it is directly over your ankle. 2. Keep good posture with your head over your shoulders. Tuck your tailbone underneath you. This will prevent your back from arching too much. 3. You should feel a gentle stretch in the front of your back thigh or hip (hip flexors). If you do not feel a stretch, slowly lunge forward with your chest up. 4. Hold this position for ____15______ seconds. 5. Slowly return to the starting position. Repeat ___3_______ times. Complete this exercise ______3____ times a day. Strengthening exercises These exercises build strength and endurance in your hip and pelvis. Endurance is the ability to use your muscles for a long time, even after they get tired. Bridge This exercise strengthens the muscles that move your thigh backward (hip extensors). 1. Lie on your back on a firm surface with your knees bent and your feet flat on the floor. 2. Tighten your buttocks muscles and lift your bottom off the floor until the trunk of your body is level with your thighs. ? You should feel the muscles working in your buttocks and the back of your thighs. If this exercise is too easy, cross your arms over your chest or lift one leg while  your bottom is up and off the floor. ? Do not arch your back. 3. Hold this position for ____15______ seconds. 4. Slowly lower your hips to the starting position. 5. Let your muscles relax completely after each repetition. Repeat ____3______ times. Complete this exercise ____3______ times a day. Straight leg raises, side-lying This exercise strengthens the muscles that rotate the leg at the hip and move it away from your body (hip abductors). 1. Lie on your side with your left / right leg in the top position. Lie so your head, shoulder, knee, and hip line up. Bend your bottom knee slightly to help you balance. 2. Lift your top leg 4-6 inches (10-15 cm) while keeping your toes pointed straight ahead. 3. Hold this position for ____15______ seconds. 4. Slowly lower your leg to the starting position. 5. Let your muscles relax completely after each repetition. Repeat ___3_______ times. Complete this exercise _____3_____ times a day. Hip abduction, quadruped This is an exercise in which your hands and knees are on the floor, and you lift one knee out to the side. 1. Get on your hands and knees on a firm, lightly padded surface. Your hands should be directly below your shoulders, and your knees should be directly below your hips. 2. Lift your left / right knee out to the side. Keep your knee bent. Do not twist your body. 3. Hold this position for ____15______ seconds. 4. Slowly lower your leg back to the starting position. Repeat _______3___ times. Complete this exercise ______3____ times a day. Single  leg stand 1. Stand near a counter or door frame. Hold on to it as needed. It is helpful to look in a mirror for this exercise so you can watch your hip. 2. Squeeze your left / right buttocks muscles, then lift up your other foot. Do not let your left / right hip push out to the side. 3. Hold this position for _____15_____ seconds. Repeat _____3_____ times. Complete this exercise _____3_____ times a  day. This information is not intended to replace advice given to you by your health care provider. Make sure you discuss any questions you have with your health care provider. Document Revised: 08/18/2018 Document Reviewed: 02/23/2018 Elsevier Patient Education  [place] Syndrome  Gluteus medius syndrome causes pain on the outside of your hip due to repeated overstretching or overworking of the gluteus medius muscle. This muscle runs from the top, outer part of your pelvis to the top of your thigh bone (femur). This muscle helps you lift your leg to the side and rotate your leg. It also keeps your hip stable and aligned when you are standing, walking, or running. What are the causes? This condition is caused by small injuries to the gluteus medius muscle over time.  It happens from repetitive movements or a sudden increase in the amount or intensity of activity that involves the legs.  It starts with muscle inflammation and may lead to small tears and scarring in your muscle. What increases the risk? You are more likely to develop this condition if you:  Run on soft or uneven surfaces, such as sand or grass.  Have weakness in your hips and core muscles.  Run long distances.  Increase your time, distance, or intensity of a sport over a short period of time. What are the signs or symptoms? The main symptom of this condition is pain on the outside of your hip with activity.  Usually, the pain will: ? Increase the longer you play sports or run. ? Decrease with rest.  Your hip may also be tender to the touch and you may have muscle spasms. How is this diagnosed? This condition is diagnosed based on your symptoms, medical history, and physical exam.  During the exam, your health care provider will touch different areas of your hip to test for pain.  You may also have imaging studies, such as X-rays and MRI, to rule out other causes of pain. How is this  treated? This condition may be treated by a combination of treatments:  Decreasing mileage or time spent doing sports.  Having a coach help you with your running form.  Stretching or strengthening exercises (physical therapy).  Ice or heat therapy.  Massage.  Local electrical stimulation (transcutaneous electrical nerve stimulation, TENS).  Trigger point injection. A steroid or numbing medicine is injected in the area where you have pain. Follow these instructions at home: Managing pain, stiffness, and swelling      If directed, put ice on the injured area. ? Put ice in a plastic bag. ? Place a towel between your skin and the bag. ? Leave the ice on for 20 minutes, 2-3 times a day.  If directed, apply heat to the affected area before you exercise. Use the heat source that your health care provider recommends, such as a moist heat pack or a heating pad. ? Place a towel between your skin and the heat source. ? Leave the heat on for 20-30 minutes. ? Remove the heat if  your skin turns bright red. This is especially important if you are unable to feel pain, heat, or cold. You may have a greater risk of getting burned. Activity  Return to your normal activities as told by your health care provider. Ask your health care provider what activities are safe for you.  Do exercises as told by your physical therapist.  Try not to lie on your painful side. When lying on your other side, put a pillow between your knees to decrease strain on your top hip muscles. General instructions  Take over-the-counter and prescription medicines only as told by your health care provider.  Keep all follow-up visits as told by your health care provider. This is important. How is this prevented?  Warm up and stretch before being active.  Cool down and stretch after being active.  Give your body time to rest between periods of activity.  Include a variety of exercises and activities in your routine  to avoid overuse injuries.  Maintain physical fitness, including: ? Strength. ? Flexibility. ? Cardiovascular fitness. ? Endurance. Contact a health care provider if:  Your pain does not get better or it gets worse. Summary  Gluteus medius syndrome causes pain on the outside of your hip due to repeated overstretching or overworking of the gluteus medius muscle.  This condition is caused by small injuries to the gluteus medius muscle over time.  Treatments may include physical therapy, massage, and trigger point injection. This information is not intended to replace advice given to you by your health care provider. Make sure you discuss any questions you have with your health care provider. Document Revised: 08/18/2018 Document Reviewed: 09/27/2017 Elsevier Patient Education  [place].

## 2019-09-19 ENCOUNTER — Encounter: Payer: Self-pay | Admitting: Registered Nurse

## 2019-09-19 ENCOUNTER — Other Ambulatory Visit: Payer: Self-pay

## 2019-09-19 ENCOUNTER — Ambulatory Visit: Payer: Self-pay | Admitting: Physician Assistant

## 2019-09-19 VITALS — BP 117/73 | HR 59 | Temp 97.9°F

## 2019-09-19 DIAGNOSIS — L989 Disorder of the skin and subcutaneous tissue, unspecified: Secondary | ICD-10-CM

## 2019-09-19 NOTE — Progress Notes (Signed)
64y/o Caucasian established female pt c/o scabbed area on lateral R lower leg. Present for over a year. Has become itchy. Applied hydrocortisone cream to it last week, as well as Fluorouracil cream 5% without change.  Subjective:    Patient ID: Monica Wyatt, female    DOB: [birth year], 64 y.o.   MRN: [id number]  HPI   As noted above- Patient has been aware for well over a year of very small lesion right anterior shin. Has treated with alternating meds " my husbands cancer cream" and OTC hydrocortisone. About a week ago happened to nick it with a razor and that spot still healing.  Has become anxious about lesion. Is established with [facility] and has already called and scheduled October 16, 2019  Now concerned re: delay. Recognizes and reports that lesion is unchanged in color,shape or size except for minor trauma nick currently healing.  Review of Systems Status quo per patient    Objective:   Physical Exam VSS as noted Alert interactive, mildly anxious, concerned  Lesion right anterior shin less than 1 cm x .25 cm Light brown in color, quite uniform except minor trauma Flat, margins smooth, no mounding or irregularity Remainder of exam right lower leg negative for obvious lesions  Upper arms with evidence long standing sun damage- Patient reports as unchanged for years    Assessment & Plan:  Discussed patient concerns and specifically addressed her use of "cancer cream" and anxiety over lesion. Supported in her scheduling of appointment for evaluation with [facility]- she is anxious about delay. Suggested call-in to check on cancellations if she wishes reschedule- have reassured that less than a month as currently scheduled is appropriate as well Discussed recommendation for yearly skin review exam routinely Have asked that she avoid any and all creams to the lesion between now and her Derm visit. Reminded we want them to see her skin and not cream effects on  skin. She expresses understanding and agrees. Will report back with results of visit prn

## 2019-10-16 ENCOUNTER — Ambulatory Visit: Payer: PRIVATE HEALTH INSURANCE | Admitting: Dermatology

## 2019-10-16 ENCOUNTER — Encounter: Payer: Self-pay | Admitting: Dermatology

## 2019-10-16 ENCOUNTER — Other Ambulatory Visit: Payer: Self-pay

## 2019-10-16 DIAGNOSIS — D225 Melanocytic nevi of trunk: Secondary | ICD-10-CM | POA: Diagnosis not present

## 2019-10-16 DIAGNOSIS — D485 Neoplasm of uncertain behavior of skin: Secondary | ICD-10-CM

## 2019-10-16 DIAGNOSIS — D229 Melanocytic nevi, unspecified: Secondary | ICD-10-CM

## 2019-10-16 DIAGNOSIS — D2372 Other benign neoplasm of skin of left lower limb, including hip: Secondary | ICD-10-CM | POA: Diagnosis not present

## 2019-10-16 DIAGNOSIS — D239 Other benign neoplasm of skin, unspecified: Secondary | ICD-10-CM

## 2019-10-16 NOTE — Patient Instructions (Signed)

## 2019-10-23 ENCOUNTER — Encounter: Payer: Self-pay | Admitting: Dermatology

## 2019-10-23 NOTE — Progress Notes (Addendum)
   Follow-Up Visit   Subjective  Monica Wyatt is a 64 y.o. female who presents for the following: Skin Problem (right outer leg- x years- keeps getting darker).  Growth Location: Right leg Duration:  Quality:  Associated Signs/Symptoms: Modifying Factors:  Severity:  Timing: Context:   The following portions of the chart were reviewed this encounter and updated as appropriate: Tobacco  Allergies  Meds  Problems  Med Hx  Surg Hx  Fam Hx      Objective  Well appearing patient in no apparent distress; mood and affect are within normal limits.  All sun exposed areas plus back examined.   Assessment & Plan  Neoplasm of uncertain behavior of skin Right Lower Leg - Anterior  Skin / nail biopsy Type of biopsy: tangential   Informed consent: discussed and consent obtained   Timeout: patient name, date of birth, surgical site, and procedure verified   Procedure prep:  Patient was prepped and draped in usual sterile fashion Prep type:  Chlorhexidine Anesthesia: the lesion was anesthetized in a standard fashion   Anesthetic:  1% lidocaine w/ epinephrine 1-100,000 local infiltration Instrument used: flexible razor blade   Hemostasis achieved with: ferric subsulfate   Outcome: patient tolerated procedure well   Post-procedure details: wound care instructions given    Skin / nail biopsy Type of biopsy: tangential   Informed consent: discussed and consent obtained   Timeout: patient name, date of birth, surgical site, and procedure verified   Procedure prep:  Patient was prepped and draped in usual sterile fashion Prep type:  Chlorhexidine Anesthesia: the lesion was anesthetized in a standard fashion   Anesthetic:  1% lidocaine w/ epinephrine 1-100,000 local infiltration Instrument used: flexible razor blade   Hemostasis achieved with: ferric subsulfate   Outcome: patient tolerated procedure well   Post-procedure details: sterile dressing applied   Dressing type:  petrolatum    Specimen 1 - Surgical pathology Differential Diagnosis: scc vs bcc Check Margins: No  Dermatofibroma Left Thigh - Posterior  Leave if stable  Nevus Mid Back  Annual skin exam

## 2019-11-21 ENCOUNTER — Ambulatory Visit: Payer: Self-pay | Admitting: Registered Nurse

## 2019-11-21 ENCOUNTER — Other Ambulatory Visit: Payer: Self-pay

## 2019-11-21 VITALS — BP 124/70 | HR 65 | Temp 98.4°F

## 2019-11-21 DIAGNOSIS — S80811A Abrasion, right lower leg, initial encounter: Secondary | ICD-10-CM

## 2019-11-21 DIAGNOSIS — N3001 Acute cystitis with hematuria: Secondary | ICD-10-CM

## 2019-11-21 LAB — POCT URINALYSIS DIPSTICK
Bilirubin, UA: NEGATIVE
Glucose, UA: NEGATIVE mg/dL
Ketones, POC UA: NEGATIVE mg/dL
Nitrite, UA: NEGATIVE
Protein Ur, POC: NEGATIVE mg/dL
Specific Gravity, UA: 1.015 (ref 1.005–1.030)
Urobilinogen, UA: 0.2 E.U./dL
pH, UA: 7 (ref 5.0–8.0)

## 2019-11-21 MED ORDER — BACITRACIN-NEOMYCIN-POLYMYXIN 400-5-5000 EX OINT: 1.0000 "application " | TOPICAL_OINTMENT | Freq: Two times a day (BID) | CUTANEOUS | 0 refills | Status: AC

## 2019-11-21 MED ORDER — SULFAMETHOXAZOLE-TRIMETHOPRIM 800-160 MG PO TABS: 1.0000 | ORAL_TABLET | Freq: Two times a day (BID) | ORAL | 0 refills | Status: AC

## 2019-11-21 NOTE — Progress Notes (Signed)
Subjective:    Patient ID: Monica Wyatt, female    DOB: 1955/06/27, 64 y.o.   MRN: [id number]  63y/o Caucasian established female pt c/o UTI sx that started this morning. C/o suprapubic pain, pain with urination, malodorous, cloudy urine. Also with urinary urgency, frequency. Denied gross hematuria, headache, fever, chills, back/flank pain, swelling of hands/feet.  Last UTI about a year ago per chart review and treated with bactrim DS po BID x 3 days successfully/symptoms resolved.  Patient also has nonhealing wound from scraping/biopsy at dermatology patient was told noncancerous by her other provider but area still hasn't completely healed one month after procedure.  Denied drainage/bleeding/pain.     Review of Systems  Constitutional: Negative for activity change, appetite change, chills, diaphoresis, fatigue and fever.  HENT: Negative for trouble swallowing and voice change.   Eyes: Negative for photophobia and visual disturbance.  Respiratory: Negative for cough, choking, shortness of breath, wheezing and stridor.   Cardiovascular: Negative for palpitations.  Gastrointestinal: Positive for abdominal pain. Negative for abdominal distention, diarrhea, nausea and vomiting.  Endocrine: Negative for cold intolerance and heat intolerance.  Genitourinary: Positive for dysuria and frequency. Negative for difficulty urinating, enuresis, flank pain, genital sores and hematuria.  Musculoskeletal: Negative for arthralgias, back pain, gait problem, joint swelling, myalgias, neck pain and neck stiffness.  Skin: Negative for rash.  Allergic/Immunologic: Negative for food allergies.  Neurological: Negative for dizziness, tremors, seizures, syncope, facial asymmetry, speech difficulty, weakness, light-headedness, numbness and headaches.  Hematological: Negative for adenopathy. Does not bruise/bleed easily.  Psychiatric/Behavioral: Negative for agitation, confusion and sleep disturbance.        Objective:   Physical Exam Vitals and nursing note reviewed.  Constitutional:      General: She is awake. She is not in acute distress.    Appearance: Normal appearance. She is well-developed, well-groomed and normal weight. She is not ill-appearing or toxic-appearing.  HENT:     Head: Normocephalic and atraumatic.     Jaw: There is normal jaw occlusion.     Salivary Glands: Right salivary gland is not diffusely enlarged or tender. Left salivary gland is not diffusely enlarged or tender.     Right Ear: Hearing and external ear normal.     Left Ear: Hearing and external ear normal.     Nose: Nose normal.     Mouth/Throat:     Mouth: No angioedema.     Pharynx: Oropharynx is clear.  Eyes:     General: Lids are normal. Vision grossly intact. Gaze aligned appropriately. Allergic shiner present. No visual field deficit or scleral icterus.       Right eye: No discharge.        Left eye: No discharge.     Extraocular Movements: Extraocular movements intact.     Conjunctiva/sclera: Conjunctivae normal.     Pupils: Pupils are equal, round, and reactive to light.  Neck:     Thyroid: No thyromegaly.     Trachea: Trachea and phonation normal.  Cardiovascular:     Rate and Rhythm: Normal rate and regular rhythm.     Pulses: Normal pulses.          Radial pulses are 2+ on the right side and 2+ on the left side.     Heart sounds: Normal heart sounds. No murmur heard.  No friction rub. No gallop.   Pulmonary:     Effort: Pulmonary effort is normal. No respiratory distress.     Breath sounds: Normal breath sounds and  air entry. No stridor, decreased air movement or transmitted upper airway sounds. No decreased breath sounds, wheezing, rhonchi or rales.     Comments: Wearing cloth mask due to covid 19 pandemic; spoke full sentences without difficulty; no cough observed in clinic Abdominal:     General: Abdomen is flat. Bowel sounds are decreased. There is no distension or abdominal bruit. There  are no signs of injury.     Palpations: Abdomen is soft. There is no shifting dullness, fluid wave, hepatomegaly, splenomegaly, mass or pulsatile mass.     Tenderness: There is abdominal tenderness in the suprapubic area. There is no right CVA tenderness, left CVA tenderness, guarding or rebound. Negative signs include Murphy's sign.     Hernia: No hernia is present. There is no hernia in the umbilical area or ventral area.       Comments: Dull to percussion x 4 quads; suprapubic pain with palpation mild and with voiding; hypoactive bowel sounds x 4 quads  Musculoskeletal:        General: No swelling, tenderness, deformity or signs of injury. Normal range of motion.     Right shoulder: Normal.     Left shoulder: Normal.     Right elbow: Normal.     Left elbow: Normal.     Right hand: Normal.     Left hand: Normal.     Cervical back: Normal, normal range of motion and neck supple. No swelling, edema, deformity, erythema, signs of trauma, lacerations, rigidity, spasms, torticollis, tenderness or crepitus. No pain with movement. Normal range of motion.     Thoracic back: Normal.     Lumbar back: Normal.     Right lower leg: No edema.     Left lower leg: No edema.  Lymphadenopathy:     Head:     Right side of head: No submental, submandibular, tonsillar or preauricular adenopathy.     Left side of head: No submental, submandibular, tonsillar or preauricular adenopathy.     Cervical: No cervical adenopathy.     Right cervical: No superficial cervical adenopathy.    Left cervical: No superficial cervical adenopathy.  Skin:    General: Skin is warm and dry.     Capillary Refill: Capillary refill takes less than 2 seconds.     Coloration: Skin is not ashen, cyanotic, jaundiced, mottled, pale or sallow.     Findings: Abrasion, erythema and rash present. No abscess, acne, bruising, burn, ecchymosis, signs of injury, laceration, lesion, petechiae or wound. Rash is macular and papular. Rash is not  crusting, nodular, purpuric, pustular, scaling, urticarial or vesicular.     Nails: There is no clubbing.          Comments: Nummular 1cm lesion right lower extremity scabbed with faint erythema and 2 rectangles adjacent 1cmx2cm macular erythema faint (bandaid outline)  Neurological:     General: No focal deficit present.     Mental Status: She is alert and oriented to person, place, and time. Mental status is at baseline.     GCS: GCS eye subscore is 4. GCS verbal subscore is 5. GCS motor subscore is 6.     Cranial Nerves: Cranial nerves are intact. No cranial nerve deficit, dysarthria or facial asymmetry.     Sensory: Sensation is intact. No sensory deficit.     Motor: Motor function is intact. No weakness, tremor, atrophy, abnormal muscle tone or seizure activity.     Coordination: Coordination is intact. Coordination normal.     Gait: Gait  is intact. Gait normal.     Comments: Gait sure and steady in clinic; on/off exam table and in/out of chair without difficulty; standing to sitting to supine and reverse without difficulty; bilateral hand grasp equal bilaterally  Psychiatric:        Attention and Perception: Attention and perception normal.        Mood and Affect: Mood and affect normal.        Speech: Speech normal.        Behavior: Behavior normal. Behavior is cooperative.        Thought Content: Thought content normal.        Cognition and Memory: Cognition and memory normal.        Judgment: Judgment normal.      1cm abrasion right lateral calf scabbed mild erythema and noted erythema in shape of rectangular bandaid 3 inches each side Suprapubic discomfort with palpation dull to percussion x 4 quads and negative CVA tenderness hypoactive bowel sounds    reviewed dipstick ua results with patient and discussed sending for urine culture as UTI last year as well and no cultures on file in Epic. Starting bactrim ds po BID from [facility] today previously tolerated well.  Patient  verbalized understanding information/instructions and had no further questions at this time. Assessment & Plan:  A-abrasion right lower leg initial encounter, UTI with hematuria  P-Medications as directed.  Bactrim ds po BID x 3 days #40 RF0 dispensed from [facility] to patient and well tolerated in 2020.   Patient is also to push fluids noncaffeinated and may use Pyridium 200mg  po TID as needed or azo OTC prn per manufacturer's instructions; patient refused Rx for pyridium. Hydrate, avoid dehydration. Avoid holding urine void on frequent basis every 4 to 6 hours. If unable to void every 8 hours, cola or tea colored urine or repetitive vomiting, worsening abdomen or new back pain follow up for re-evaluation with PCM, urgent care or ER same day.   Discussed typically 72 hours for urine culture results. Call or return to clinic as needed if these symptoms worsen or fail to improve as anticipated. Exitcare handout on UTI printed and given to patient  Patient verbalized agreement and understanding of treatment plan and had no further questions at this time.   Patient was instructed do not soak leg in dirty water until abrasion healed avoid pool, lake, hot tub, dirty sink water. May shower apply neosporin/triple antibiotic once to twice a day after washing with soap and water.  Keep wound covered they will heal faster and prevent contamination rubbing from clothing tearing off scabs.  Given triple antibiotic and bandaids from clinic Change prn soiling. Exitcare handout on abrasion printed and given to patient. Medications as directed. Call or return to clinic as needed if these symptoms worsen or fail to improve as anticipated. Patient verbalized agreement and understanding of treatment plan. P2: ROM, injury prevention  P2: Hydrate and cranberry juice

## 2019-11-21 NOTE — Patient Instructions (Signed)
Abrasion  An abrasion is a cut or a scrape on the outer surface of the skin. An abrasion does not go through all the layers of the skin. It is important to care for your abrasion properly to prevent infection. What are the causes? This condition is caused by falling on or gliding across the ground or another surface. When your skin rubs on something, the outer and inner layers of skin may rub off. What are the signs or symptoms? The main symptom of this condition is a cut or a scrape. The scrape may be bleeding, or it may appear red or pink. If the abrasion was caused by a fall, there may be a bruise under the cut or scrape. How is this diagnosed? An abrasion is diagnosed with a physical exam. How is this treated? Treatment for this condition depends on how large and deep the abrasion is. In most cases:  Your abrasion will be cleaned with water and mild soap. This is done to remove any dirt or debris (such as particles of glass or rock) that may be stuck in the wound.  An antibiotic ointment may be applied to the abrasion to help prevent infection.  A bandage (dressing) may be placed on the abrasion to keep it clean. You may also need a tetanus shot. Follow these instructions at home: Medicines  Take or apply over-the-counter and prescription medicines only as told by your health care provider.  If you were prescribed an antibiotic medicine, apply it as told by your health care provider. Wound care  Clean the wound 2-3 times a day, or as directed by your health care provider. To do this, wash the wound with mild soap and water, rinse off the soap, and pat the wound dry with a clean towel. Do not rub the wound.  Keep the dressing clean and dry as told by your health care provider.  There are many different ways to close and cover a wound. Follow instructions from your health care provider about: ? Caring for your wound. ? Changing and removing your dressing. You may have to change your  dressing one or more times a day, or as directed by your health care provider.  Check your wound every day for signs of infection. Check for: ? Redness, particularly a red streak that spreads out from the wound. ? Swelling or increased pain. ? Warmth. ? Fluid, pus, or a bad smell.  If directed, put ice on the injured area to reduce pain and swelling: ? Put ice in a plastic bag. ? Place a towel between your skin and the bag. ? Leave the ice on for 20 minutes, 2-3 times a day. General instructions  Do not take baths, swim, or use a hot tub until your health care provider says it is okay to do so.  If possible, raise (elevate) the injured area above the level of your heart while you are sitting or lying down. This will reduce pain and swelling.  Keep all follow-up visits as directed by your health care provider. This is important. Contact a health care provider if:  You received a tetanus shot, and you have swelling, severe pain, redness, or bleeding at the injection site.  Your pain is not controlled with medicine.  You have redness, swelling, or more pain at the site of your wound. Get help right away if:  You have a red streak spreading away from your wound.  You have a fever.  You have fluid, blood, or   pus coming from your wound.  You notice a bad smell coming from your wound or your dressing. Summary  An abrasion is a cut or a scrape on the outer surface of the skin. An abrasion does not go through all the layers of the skin.  Care for your abrasion properly to prevent infection.  Clean the wound with mild soap and water 2-3 times a day. Follow instructions from your health care provider about taking medicines and changing your bandage (dressing).  Contact your health care provider if you have redness, swelling or more pain in the wound area.  Get help right away if you have a fever or if you have fluid, blood, pus, a bad smell, or a red streak coming from the  wound. This information is not intended to replace advice given to you by your health care provider. Make sure you discuss any questions you have with your health care provider. Document Revised: 04/09/2017 Document Reviewed: 12/09/2016 Elsevier Patient Education  [place]. Urinary Tract Infection, Adult  A urinary tract infection (UTI) is an infection of any part of the urinary tract. The urinary tract includes the kidneys, ureters, bladder, and urethra. These organs make, store, and get rid of urine in the body. Your health care provider may use other names to describe the infection. An upper UTI affects the ureters and kidneys (pyelonephritis). A lower UTI affects the bladder (cystitis) and urethra (urethritis). What are the causes? Most urinary tract infections are caused by bacteria in your genital area, around the entrance to your urinary tract (urethra). These bacteria grow and cause inflammation of your urinary tract. What increases the risk? You are more likely to develop this condition if:  You have a urinary catheter that stays in place (indwelling).  You are not able to control when you urinate or have a bowel movement (you have incontinence).  You are female and you: ? Use a spermicide or diaphragm for birth control. ? Have low estrogen levels. ? Are pregnant.  You have certain genes that increase your risk (genetics).  You are sexually active.  You take antibiotic medicines.  You have a condition that causes your flow of urine to slow down, such as: ? An enlarged prostate, if you are female. ? Blockage in your urethra (stricture). ? A kidney stone. ? A nerve condition that affects your bladder control (neurogenic bladder). ? Not getting enough to drink, or not urinating often.  You have certain medical conditions, such as: ? Diabetes. ? A weak disease-fighting system (immunesystem). ? Sickle cell disease. ? Gout. ? Spinal cord injury. What are the signs  or symptoms? Symptoms of this condition include:  Needing to urinate right away (urgently).  Frequent urination or passing small amounts of urine frequently.  Pain or burning with urination.  Blood in the urine.  Urine that smells bad or unusual.  Trouble urinating.  Cloudy urine.  Vaginal discharge, if you are female.  Pain in the abdomen or the lower back. You may also have:  Vomiting or a decreased appetite.  Confusion.  Irritability or tiredness.  A fever.  Diarrhea. The first symptom in older adults may be confusion. In some cases, they may not have any symptoms until the infection has worsened. How is this diagnosed? This condition is diagnosed based on your medical history and a physical exam. You may also have other tests, including:  Urine tests.  Blood tests.  Tests for sexually transmitted infections (STIs). If you have had  more than one UTI, a cystoscopy or imaging studies may be done to determine the cause of the infections. How is this treated? Treatment for this condition includes:  Antibiotic medicine.  Over-the-counter medicines to treat discomfort.  Drinking enough water to stay hydrated. If you have frequent infections or have other conditions such as a kidney stone, you may need to see a health care provider who specializes in the urinary tract (urologist). In rare cases, urinary tract infections can cause sepsis. Sepsis is a life-threatening condition that occurs when the body responds to an infection. Sepsis is treated in the hospital with IV antibiotics, fluids, and other medicines. Follow these instructions at home:  Medicines  Take over-the-counter and prescription medicines only as told by your health care provider.  If you were prescribed an antibiotic medicine, take it as told by your health care provider. Do not stop using the antibiotic even if you start to feel better. General instructions  Make sure you: ? Empty your bladder  often and completely. Do not hold urine for long periods of time. ? Empty your bladder after sex. ? Wipe from front to back after a bowel movement if you are female. Use each tissue one time when you wipe.  Drink enough fluid to keep your urine pale yellow.  Keep all follow-up visits as told by your health care provider. This is important. Contact a health care provider if:  Your symptoms do not get better after 1-2 days.  Your symptoms go away and then return. Get help right away if you have:  Severe pain in your back or your lower abdomen.  A fever.  Nausea or vomiting. Summary  A urinary tract infection (UTI) is an infection of any part of the urinary tract, which includes the kidneys, ureters, bladder, and urethra.  Most urinary tract infections are caused by bacteria in your genital area, around the entrance to your urinary tract (urethra).  Treatment for this condition often includes antibiotic medicines.  If you were prescribed an antibiotic medicine, take it as told by your health care provider. Do not stop using the antibiotic even if you start to feel better.  Keep all follow-up visits as told by your health care provider. This is important. This information is not intended to replace advice given to you by your health care provider. Make sure you discuss any questions you have with your health care provider. Document Revised: 04/14/2018 Document Reviewed: 11/04/2017 Elsevier Patient Education  2020 [employer].

## 2019-11-23 ENCOUNTER — Other Ambulatory Visit: Payer: Self-pay | Admitting: Physician Assistant

## 2019-11-23 NOTE — Progress Notes (Signed)
Note E.Coli on urine Cx. Sensitivity still pending.

## 2019-11-24 LAB — URINE CULTURE

## 2019-11-24 NOTE — Progress Notes (Signed)
Still awaiting sensitivity results results pending

## 2019-11-27 ENCOUNTER — Telehealth: Payer: Self-pay | Admitting: Registered Nurse

## 2019-11-27 ENCOUNTER — Telehealth: Payer: Self-pay | Admitting: General Practice

## 2019-11-27 MED ORDER — ATORVASTATIN CALCIUM 20 MG PO TABS: ORAL_TABLET | ORAL | 1 refills | Status: DC

## 2019-11-27 NOTE — Telephone Encounter (Signed)
Path notes reviewed from 10/16/19 biopsy viewed in [facility]. Appears to be irritation and excoriation related. Derm had recommended vaseline and bandaid which she has also completed along with triple abx ointment and bandaids with no improvement. Appt made for 1030 tomorrow in clinic with NP. Pt agreeable.

## 2019-11-27 NOTE — Telephone Encounter (Signed)
Patient contacted RN Hildred Alamin today leg wound still not healing with aquaphor and had 3 day course bactrim DS for UTI last week.  Epic review noted patient saw PA Lee May 2021 and was told to follow up with [facility].  Patient reported to me she did see dermatology last week and scraping performed for biopsy.  Can you double check with their office path results and put patient on my schedule tomorrow for re-evaluation of leg lesion.

## 2019-11-27 NOTE — Telephone Encounter (Signed)
Please schedule patient for re-evaluation tomorrow

## 2019-11-27 NOTE — Progress Notes (Signed)
Sensitivity confirms E.coli infection susceptible to Bactrim. Spoke with pt over phone. She verified UTI sx have improved. She has completed the 3 day course of Bactrim. Advised her she could store the remainder of the bottle in a cool, dark cabinet.  She also reports that leg wound has not improved despite daily cleansing, triple abx ointment and dressings. Advised her RN would discuss with NP for any further recommendations.

## 2019-11-27 NOTE — Addendum Note (Signed)
Addended by: Fonnie Mu on: 11/27/2019 03:38 PM   Modules accepted: Orders

## 2019-11-27 NOTE — Telephone Encounter (Signed)
Patient is down to last pill of Lipitor and needs a refill. [facility].

## 2019-11-27 NOTE — Telephone Encounter (Signed)
Patient will have appt with me tomorrow for re-evaluation.  Was able to see Jun 2021 path report and dermatology note through link with path report just noted excoriation.  Probable plan triamcinolone as evaluation last week showed dermatitis surrounding affected area in shape of bandaid and no drainage but will re-evaluate tomorrow.

## 2019-11-28 ENCOUNTER — Encounter: Payer: Self-pay | Admitting: Registered Nurse

## 2019-11-28 ENCOUNTER — Ambulatory Visit: Payer: Self-pay | Admitting: Registered Nurse

## 2019-11-28 ENCOUNTER — Other Ambulatory Visit: Payer: Self-pay

## 2019-11-28 VITALS — HR 70 | Resp 16

## 2019-11-28 DIAGNOSIS — L231 Allergic contact dermatitis due to adhesives: Secondary | ICD-10-CM

## 2019-11-28 MED ORDER — AQUAPHOR EX OINT: TOPICAL_OINTMENT | CUTANEOUS | 0 refills | Status: DC | PRN

## 2019-11-28 MED ORDER — TRIAMCINOLONE ACETONIDE 0.1 % EX CREA: 1.0000 "application " | TOPICAL_CREAM | Freq: Two times a day (BID) | CUTANEOUS | 0 refills | Status: AC

## 2019-11-28 NOTE — Patient Instructions (Signed)
Contact Dermatitis Dermatitis is redness, soreness, and swelling (inflammation) of the skin. Contact dermatitis is a reaction to certain substances that touch the skin. Many different substances can cause contact dermatitis. There are two types of contact dermatitis:  Irritant contact dermatitis. This type is caused by something that irritates your skin, such as having dry hands from washing them too often with soap. This type does not require previous exposure to the substance for a reaction to occur. This is the most common type.  Allergic contact dermatitis. This type is caused by a substance that you are allergic to, such as poison ivy. This type occurs when you have been exposed to the substance (allergen) and develop a sensitivity to it. Dermatitis may develop soon after your first exposure to the allergen, or it may not develop until the next time you are exposed and every time thereafter. What are the causes? Irritant contact dermatitis is most commonly caused by exposure to:  Makeup.  Soaps.  Detergents.  Bleaches.  Acids.  Metal salts, such as nickel. Allergic contact dermatitis is most commonly caused by exposure to:  Poisonous plants.  Chemicals.  Jewelry.  Latex.  Medicines.  Preservatives in products, such as clothing. What increases the risk? You are more likely to develop this condition if you have:  A job that exposes you to irritants or allergens.  Certain medical conditions, such as asthma or eczema. What are the signs or symptoms? Symptoms of this condition may occur on your body anywhere the irritant has touched you or is touched by you.  Symptoms include: ? Dryness or flaking. ? Redness. ? Cracks. ? Itching. ? Pain or a burning feeling. ? Blisters. ? Drainage of small amounts of blood or clear fluid from skin cracks. With allergic contact dermatitis, there may also be swelling in areas such as the eyelids, mouth, or genitals. How is this  diagnosed? This condition is diagnosed with a medical history and physical exam.  A patch skin test may be performed to help determine the cause.  If the condition is related to your job, you may need to see an occupational medicine specialist. How is this treated? This condition is treated by checking for the cause of the reaction and protecting your skin from further contact. Treatment may also include:  Steroid creams or ointments. Oral steroid medicines may be needed in more severe cases.  Antibiotic medicines or antibacterial ointments, if a skin infection is present.  Antihistamine lotion or an antihistamine taken by mouth to ease itching.  A bandage (dressing). Follow these instructions at home: Skin care  Moisturize your skin as needed.  Apply cool compresses to the affected areas.  Try applying baking soda paste to your skin. Stir water into baking soda until it reaches a paste-like consistency.  Do not scratch your skin, and avoid friction to the affected area.  Avoid the use of soaps, perfumes, and dyes. Medicines  Take or apply over-the-counter and prescription medicines only as told by your health care provider.  If you were prescribed an antibiotic medicine, take or apply the antibiotic as told by your health care provider. Do not stop using the antibiotic even if your condition improves. Bathing  Try taking a bath with: ? Epsom salts. Follow the instructions on the packaging. You can get these at your local pharmacy or grocery store. ? Baking soda. Pour a small amount into the bath as directed by your health care provider. ? Colloidal oatmeal. Follow the instructions on the   packaging. You can get this at your local pharmacy or grocery store.  Bathe less frequently, such as every other day.  Bathe in lukewarm water. Avoid using hot water. Bandage care  If you were given a bandage (dressing), change it as told by your health care provider.  Wash your hands  with soap and water before and after you change your dressing. If soap and water are not available, use hand sanitizer. General instructions  Avoid the substance that caused your reaction. If you do not know what caused it, keep a journal to try to track what caused it. Write down: ? What you eat. ? What cosmetic products you use. ? What you drink. ? What you wear in the affected area. This includes jewelry.  Check the affected areas every day for signs of infection. Check for: ? More redness, swelling, or pain. ? More fluid or blood. ? Warmth. ? Pus or a bad smell.  Keep all follow-up visits as told by your health care provider. This is important. Contact a health care provider if:  Your condition does not improve with treatment.  Your condition gets worse.  You have signs of infection such as swelling, tenderness, redness, soreness, or warmth in the affected area.  You have a fever.  You have new symptoms. Get help right away if:  You have a severe headache, neck pain, or neck stiffness.  You vomit.  You feel very sleepy.  You notice red streaks coming from the affected area.  Your bone or joint underneath the affected area becomes painful after the skin has healed.  The affected area turns darker.  You have difficulty breathing. Summary  Dermatitis is redness, soreness, and swelling (inflammation) of the skin. Contact dermatitis is a reaction to certain substances that touch the skin.  Symptoms of this condition may occur on your body anywhere the irritant has touched you or is touched by you.  This condition is treated by figuring out what caused the reaction and protecting your skin from further contact. Treatment may also include medicines and skin care.  Avoid the substance that caused your reaction. If you do not know what caused it, keep a journal to try to track what caused it.  Contact a health care provider if your condition gets worse or you have signs  of infection such as swelling, tenderness, redness, soreness, or warmth in the affected area. This information is not intended to replace advice given to you by your health care provider. Make sure you discuss any questions you have with your health care provider. Document Revised: 08/17/2018 Document Reviewed: 11/10/2017 Elsevier Patient Education  2020 Else[employer] Inc.  

## 2019-11-28 NOTE — Progress Notes (Signed)
Subjective:    Patient ID: Monica Wyatt, female    DOB: 1955/12/14, 64 y.o.   MRN: [id number]  63y/o caucasian female established patient here for re-evaluation rash/lesion right lower leg not healing.  3 day course triple antibiotic/aquaphor and bactrim DS  Minimal improvement.  See 11/21/2019 office note my initial evaluation.  No discharge just continued scab and redness around scab, no swelling, not particularly itchy.  Last shaved legs a couple days ago.  Had biopsy by dermatology for mole with nonhealing area right lower leg over a month ago.  Pathology results noncancerous per patient.     Review of Systems     Objective:   Physical Exam Constitutional:      General: She is awake. She is not in acute distress.    Appearance: Normal appearance. She is well-developed, well-groomed and normal weight. She is not ill-appearing, toxic-appearing or diaphoretic.  HENT:     Head: Normocephalic and atraumatic.     Jaw: There is normal jaw occlusion.     Salivary Glands: Right salivary gland is not diffusely enlarged. Left salivary gland is not diffusely enlarged.     Right Ear: External ear normal.     Left Ear: External ear normal.     Nose: Nose normal.  Eyes:     General: Lids are normal. Vision grossly intact. Gaze aligned appropriately. No visual field deficit or scleral icterus.       Right eye: No discharge.        Left eye: No discharge.     Extraocular Movements: Extraocular movements intact.     Conjunctiva/sclera: Conjunctivae normal.     Pupils: Pupils are equal, round, and reactive to light.  Neck:     Thyroid: No thyromegaly or thyroid tenderness.     Trachea: Trachea normal.  Cardiovascular:     Rate and Rhythm: Normal rate and regular rhythm.     Pulses: Normal pulses.          Radial pulses are 2+ on the right side and 2+ on the left side.       Dorsalis pedis pulses are 2+ on the right side and 2+ on the left side.  Pulmonary:     Effort: Pulmonary effort  is normal. No respiratory distress.     Breath sounds: Normal breath sounds and air entry. No stridor, decreased air movement or transmitted upper airway sounds. No decreased breath sounds, wheezing, rhonchi or rales.     Comments: Wearing cloth mask due to covid 19 pandemic; spoke full sentences without difficulty; no cough observed in exam room Abdominal:     General: Abdomen is flat.  Musculoskeletal:        General: No swelling, tenderness, deformity or signs of injury. Normal range of motion.     Right shoulder: Normal.     Left shoulder: Normal.     Right elbow: Normal.     Right hand: Normal.     Left hand: Normal.     Cervical back: Normal, normal range of motion and neck supple. No rigidity.     Thoracic back: Normal.     Lumbar back: Normal.     Right hip: Normal.     Left hip: Normal.     Right knee: Normal.     Left knee: Normal.     Right lower leg: Normal. No edema.     Left lower leg: Normal. No edema.     Right ankle: Normal.  Left ankle: Normal.  Lymphadenopathy:     Head:     Right side of head: No submental, submandibular, tonsillar or preauricular adenopathy.     Left side of head: No submental, submandibular, tonsillar or preauricular adenopathy.     Cervical: No cervical adenopathy.     Right cervical: No superficial cervical adenopathy.    Left cervical: No superficial cervical adenopathy.  Skin:    General: Skin is warm and dry.     Capillary Refill: Capillary refill takes less than 2 seconds.     Coloration: Skin is not ashen, cyanotic, jaundiced, mottled, pale or sallow.     Findings: Erythema, lesion and rash present. No abrasion, abscess, acne, bruising, burn, ecchymosis, signs of injury, laceration, petechiae or wound. Rash is macular and papular. Rash is not crusting, nodular, purpuric, pustular, scaling, urticarial or vesicular.     Nails: There is no clubbing.          Comments: Macular erythema rectangular (bandaid shape) surrounding nummular  brown scabbed lesion 1 cm diameter dry nontender right lower leg  Neurological:     General: No focal deficit present.     Mental Status: She is alert and oriented to person, place, and time. Mental status is at baseline.     GCS: GCS eye subscore is 4. GCS verbal subscore is 5. GCS motor subscore is 6.     Cranial Nerves: Cranial nerves are intact. No cranial nerve deficit, dysarthria or facial asymmetry.     Sensory: Sensation is intact. No sensory deficit.     Motor: Motor function is intact. No weakness, tremor, atrophy, abnormal muscle tone or seizure activity.     Coordination: Coordination is intact. Coordination normal.     Gait: Gait is intact. Gait normal.     Comments: Gait sure and steady in hallway; in/out of chair without difficulty; bilateral hand grasp equal 5/5  Psychiatric:        Attention and Perception: Attention and perception normal.        Mood and Affect: Mood and affect normal.        Speech: Speech normal.        Behavior: Behavior normal. Behavior is cooperative.        Thought Content: Thought content normal.        Cognition and Memory: Cognition and memory normal.        Judgment: Judgment normal.           Assessment & Plan:  A-contact dermatitis due to adhesive bandage subsequent visit  P-continue plan of care as previously discussed given additional supplies telfa gauze, aquaphor, stockinette to hold gauze in place right lower extremity.  Electronic Rx triamcinolone 0.1% apply daily to BID x 7 days #1 RF0 then apply aquaphor over top BID.  Wash hands before and after applying emollient/steroid cream.  Re-evaluation in 1 week.  Follow up sooner if pain, itching, swelling, discharge, redness worsening.  Hold on shaving over affected area until rash resolved.  Patient was instructed do not soak leg in dirty water until abrasion healed avoid pool, lake, hot tub, dirty sink water. Wash area with soap and water daily Keep wound covered with aquaphor or  vaseline will heal faster and prevent contamination rubbing from clothing tearing off scabs.   Change prn soiling. Exitcare handout on contact dermatitis printed and given to patient. Medications as directed. Call or return to clinic as needed if these symptoms worsen or fail to improve as anticipated. Patient verbalized  agreement and understanding of treatment plan and had no further questions at this time. P2: ROM, injury prevention

## 2019-12-06 ENCOUNTER — Telehealth: Payer: Self-pay | Admitting: Dermatology

## 2019-12-06 NOTE — Telephone Encounter (Signed)
Phone call to patient to let her know that it is normal for the lower leg biopsy's to take a long time to heal. She says its not infected just slow healing. She will call with any problems.

## 2019-12-06 NOTE — Telephone Encounter (Signed)
Patient calling to ask about wound on leg from scrape not healing. Patient has been putting ointment on it that PCP gave her on 7/20. She has been using it for a week and it is not helping (OV notes in Epic) Patient states no drainage. Area is not painful, red or hot to touch. It just looks the same as it did when biopsy was taken.

## 2020-03-28 ENCOUNTER — Telehealth: Payer: Self-pay | Admitting: Registered Nurse

## 2020-03-28 NOTE — Telephone Encounter (Signed)
Patient in [facility] asking if skin should be darker where biopsy performed.  Hyperpigmentation tan/red nummular 1cm lower right leg dry non scaly nontender.  Discussed with patient hyperpigmentation not uncommon with new healing skin avoid having direct sunlight on area use sunscreen and continue emollient application cocoa butter daily.  If hyperpigmentation of portion darkening, becoming scaly or bleeding then re-evaluation/rebiopsy recommended. Patient verbalized understanding information/instructions, agreed with plan of care and had no further questions at this time.  Patient saw dermatology. 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

## 2020-04-23 ENCOUNTER — Telehealth: Payer: Self-pay | Admitting: Dermatology

## 2020-04-23 NOTE — Telephone Encounter (Signed)
Patient calling about healing of Bx site from 6 months ago. Scheduled appointment.

## 2020-05-20 ENCOUNTER — Telehealth: Payer: Self-pay | Admitting: General Practice

## 2020-05-20 ENCOUNTER — Other Ambulatory Visit: Payer: Self-pay | Admitting: Physician Assistant

## 2020-05-20 MED ORDER — ATORVASTATIN CALCIUM 20 MG PO TABS: ORAL_TABLET | ORAL | 0 refills | Status: DC

## 2020-05-20 NOTE — Telephone Encounter (Signed)
error 

## 2020-05-21 ENCOUNTER — Other Ambulatory Visit: Payer: Self-pay | Admitting: Obstetrics

## 2020-05-21 DIAGNOSIS — M810 Age-related osteoporosis without current pathological fracture: Secondary | ICD-10-CM

## 2020-06-13 ENCOUNTER — Ambulatory Visit: Payer: PRIVATE HEALTH INSURANCE | Admitting: Physician Assistant

## 2020-06-13 ENCOUNTER — Other Ambulatory Visit: Payer: Self-pay

## 2020-06-13 ENCOUNTER — Encounter: Payer: Self-pay | Admitting: Physician Assistant

## 2020-06-13 VITALS — BP 102/64 | HR 70 | Temp 96.4°F | Ht 65.0 in | Wt 127.2 lb

## 2020-06-13 DIAGNOSIS — E785 Hyperlipidemia, unspecified: Secondary | ICD-10-CM

## 2020-06-13 DIAGNOSIS — Z0000 Encounter for general adult medical examination without abnormal findings: Secondary | ICD-10-CM | POA: Diagnosis not present

## 2020-06-13 DIAGNOSIS — E559 Vitamin D deficiency, unspecified: Secondary | ICD-10-CM

## 2020-06-13 DIAGNOSIS — R7303 Prediabetes: Secondary | ICD-10-CM | POA: Diagnosis not present

## 2020-06-13 NOTE — Patient Instructions (Signed)

## 2020-06-13 NOTE — Progress Notes (Signed)
Established Patient Office Visit  Subjective:  Patient ID: Monica Wyatt, female    DOB: 10-16-1955  Age: 65 y.o. MRN: [id number]  CC:  Chief Complaint  Patient presents with  . Hyperlipidemia  . Prediabetes    HPI Monica Wyatt presents for follow up on hyperlipidemia, prediabetes, and vitamin D deficiency.  HLD: Pt taking medication as directed. Reports has been on Lipitor 20 mg for a while. Does report myalgias and arthralgias. Has not tried other statins.  Prediabetes: Denies increased thirst or urination. Sometimes she does get thirsty, but does not drink much water like she should. Reports she loves sweets. At night might have a small chocolate cake with coffee or have candy.  Vitamin D deficiency: Taking Vitamin D3 supplement.  Past Medical History:  Diagnosis Date  . Headache(784.0)   . Hyperlipidemia   . Medical history non-contributory   . Osteoporosis     Past Surgical History:  Procedure Laterality Date  . HERNIA REPAIR    . HYSTEROSCOPY WITH D & C N/A 07/22/2012   Procedure: DILATATION AND CURETTAGE /HYSTEROSCOPY;  Surgeon: [name]. [name], MD;  Location: [facility];  Service: Gynecology;  Laterality: N/A;  . LAPAROSCOPY N/A 07/22/2012   Procedure: LAPAROSCOPY OPERATIVE;  Surgeon: [name] [name], MD;  Location: [facility];  Service: Gynecology;  Laterality: N/A;  . NO PAST SURGERIES    . SALPINGOOPHORECTOMY Bilateral 07/22/2012   Procedure: SALPINGO OOPHORECTOMY;  Surgeon: [name]. [name], MD;  Location: [facility];  Service: Gynecology;  Laterality: Bilateral;    Family History  Problem Relation Age of Onset  . Dementia Mother   . COPD Father   . Healthy Sister   . Alcohol abuse Brother   . Sleep apnea Son   . Healthy Sister     Social History   Socioeconomic History  . Marital status: Married    Spouse name: Not on file  . Number of children: Not on file  . Years of education: Not on file  . Highest education level: Not on file  Occupational  History  . Not on file  Tobacco Use  . Smoking status: Never Smoker  . Smokeless tobacco: Never Used  Substance and Sexual Activity  . Alcohol use: No  . Drug use: No  . Sexual activity: Yes    Birth control/protection: Surgical  Other Topics Concern  . Not on file  Social History Narrative  . Not on file   Social Determinants of Health   Financial Resource Strain: Not on file  Food Insecurity: Not on file  Transportation Needs: Not on file  Physical Activity: Not on file  Stress: Not on file  Social Connections: Not on file  Intimate Partner Violence: Not on file    Outpatient Medications Prior to Visit  Medication Sig Dispense Refill  . alendronate (FOSAMAX) 70 MG tablet Take 1 tablet by mouth once a week.  10  . AMZEEQ 4 % FOAM APPLY TO AFFECTED AREA EVERY DAY    . calcium elemental as carbonate (BARIATRIC TUMS ULTRA) 400 MG chewable tablet Chew 2.5 tablets by mouth as needed every 8 hours as needed for heartburn    . Cholecalciferol (VITAMIN D3) 2000 units TABS Take 1 tablet by mouth daily.    [name] ibuprofen (ADVIL) 200 MG tablet Take 3 tablets (600 mg total) by mouth every 6 (six) hours as needed for pain. 60 tablet 0  . atorvastatin (LIPITOR) 20 MG tablet TAKE 1 TABLET BY MOUTH DAILY AT 6  PM. 60 tablet 0  . mineral oil-hydrophilic petrolatum (AQUAPHOR) ointment Apply topically as needed for dry skin or irritation. (Patient not taking: Reported on 06/13/2020) 420 g 0   No facility-administered medications prior to visit.    Allergies  Allergen Reactions  . Penicillins Hives    Childhood reaction    ROS Review of Systems A fourteen system review of systems was performed and found to be positive as per HPI.   Objective:    Physical Exam General:  Well Developed, well nourished, appropriate for stated age.  Neuro:  Alert and oriented,  extra-ocular muscles intact  HEENT:  Normocephalic, atraumatic, neck supple Skin:  no gross rash, warm, pink. Cardiac:  RRR, S1  S2 Respiratory:  ECTA B/L w/o wheezing, Not using accessory muscles, speaking in full sentences- unlabored. Vascular:  Ext warm, no cyanosis apprec.; cap RF less 2 sec. Psych:  No HI/SI, judgement and insight good, Euthymic mood. Full Affect.   BP 102/64   Pulse 70   Temp (!) 96.4 F (35.8 C)   Ht $R'5\' 5"'Ey$  (1.651 m)   Wt 127 lb 3.2 oz (57.7 kg)   SpO2 97%   BMI 21.17 kg/m  Wt Readings from Last 3 Encounters:  06/13/20 127 lb 3.2 oz (57.7 kg)  05/10/19 125 lb 8 oz (56.9 kg)  07/20/18 123 lb 4.8 oz (55.9 kg)     Health Maintenance Due  Topic Date Due  . Hepatitis C Screening  Never done  . HIV Screening  Never done  . PAP SMEAR-Modifier  Never done  . COLONOSCOPY (Pts 45-58yrs Insurance coverage will need to be confirmed)  Never done  . INFLUENZA VACCINE  Never done    There are no preventive care reminders to display for this patient.  Lab Results  Component Value Date   TSH 3.670 05/10/2019   Lab Results  Component Value Date   WBC 3.6 06/13/2020   HGB 14.2 06/13/2020   HCT 43.3 06/13/2020   MCV 94 06/13/2020   PLT 252 06/13/2020   Lab Results  Component Value Date   NA 139 06/13/2020   K 4.6 06/13/2020   CO2 23 06/13/2020   GLUCOSE 80 06/13/2020   BUN 15 06/13/2020   CREATININE 0.93 06/13/2020   BILITOT 0.7 06/13/2020   ALKPHOS 68 06/13/2020   AST 21 06/13/2020   ALT 21 06/13/2020   PROT 7.0 06/13/2020   ALBUMIN 4.6 06/13/2020   CALCIUM 9.6 06/13/2020   Lab Results  Component Value Date   CHOL 175 06/13/2020   Lab Results  Component Value Date   HDL 57 06/13/2020   Lab Results  Component Value Date   LDLCALC 105 (H) 06/13/2020   Lab Results  Component Value Date   TRIG 68 06/13/2020   Lab Results  Component Value Date   CHOLHDL 3.1 06/13/2020   Lab Results  Component Value Date   HGBA1C 5.8 (H) 06/13/2020      Assessment & Plan:   Problem List Items Addressed This Visit      Other   Hyperlipidemia   Relevant Orders   Lipid  Profile (Completed)   Healthcare maintenance   Relevant Orders   Comp Met (CMET) (Completed)   CBC w/Diff (Completed)   HgB A1c (Completed)   Lipid Profile (Completed)   Vitamin D (25 hydroxy) (Completed)   Pre-diabetes - Primary   Relevant Orders   HgB A1c (Completed)    Other Visit Diagnoses    Vitamin D deficiency  Relevant Orders   Vitamin D (25 hydroxy) (Completed)     Hyperlipidemia: -Last lipid panel: Total cholesterol 154, triglycerides 78, HDL 56, LDL 83 -Discussed with patient changing statin therapy given complaints of myalgias.  Patient is agreeable to try Crestor.  Will repeat lipid panel and hepatic function today.  Pending results will send new prescription. -Follow a heart healthy diet and stay as active as possible.  Prediabetes: -Last A1c 5.6, stable. -Monitor/reduce carbohydrates and glucose. -Will repeat A1c today.  Vitamin D deficiency: -Last Vitamin D 30.4. Continue Vit D3 supplement. -Will repeat Vit D today.   No orders of the defined types were placed in this encounter.   Follow-up: Return in about 6 months (around 12/11/2020) for CPE.   Note:  This note was prepared with assistance of Dragon voice recognition software. Occasional wrong-word or sound-a-like substitutions may have occurred due to the inherent limitations of voice recognition software.  Lorrene Reid, PA-C

## 2020-06-14 ENCOUNTER — Encounter: Payer: Self-pay | Admitting: Physician Assistant

## 2020-06-14 ENCOUNTER — Telehealth: Payer: Self-pay | Admitting: Physician Assistant

## 2020-06-14 DIAGNOSIS — E785 Hyperlipidemia, unspecified: Secondary | ICD-10-CM

## 2020-06-14 LAB — LIPID PANEL
Chol/HDL Ratio: 3.1 ratio (ref 0.0–4.4)
Cholesterol, Total: 175 mg/dL (ref 100–199)
HDL: 57 mg/dL (ref >39)
LDL Chol Calc (NIH): 105 mg/dL — ABNORMAL HIGH (ref 0–99)
Triglycerides: 68 mg/dL (ref 0–149)
VLDL Cholesterol Cal: 13 mg/dL (ref 5–40)

## 2020-06-14 LAB — CBC WITH DIFFERENTIAL/PLATELET
Basophils Absolute: 0 10*3/uL (ref 0.0–0.2)
Basos: 1 %
EOS (ABSOLUTE): 0.1 10*3/uL (ref 0.0–0.4)
Eos: 2 %
Hematocrit: 43.3 % (ref 34.0–46.6)
Hemoglobin: 14.2 g/dL (ref 11.1–15.9)
Immature Grans (Abs): 0 10*3/uL (ref 0.0–0.1)
Immature Granulocytes: 0 %
Lymphocytes Absolute: 1.5 10*3/uL (ref 0.7–3.1)
Lymphs: 42 %
MCH: 30.7 pg (ref 26.6–33.0)
MCHC: 32.8 g/dL (ref 31.5–35.7)
MCV: 94 fL (ref 79–97)
Monocytes Absolute: 0.5 10*3/uL (ref 0.1–0.9)
Monocytes: 14 %
Neutrophils Absolute: 1.5 10*3/uL (ref 1.4–7.0)
Neutrophils: 41 %
Platelets: 252 10*3/uL (ref 150–450)
RBC: 4.62 x10E6/uL (ref 3.77–5.28)
RDW: 12.3 % (ref 11.7–15.4)
WBC: 3.6 10*3/uL (ref 3.4–10.8)

## 2020-06-14 LAB — COMPREHENSIVE METABOLIC PANEL
ALT: 21 IU/L (ref 0–32)
AST: 21 IU/L (ref 0–40)
Albumin/Globulin Ratio: 1.9 (ref 1.2–2.2)
Albumin: 4.6 g/dL (ref 3.8–4.8)
Alkaline Phosphatase: 68 IU/L (ref 44–121)
BUN/Creatinine Ratio: 16 (ref 12–28)
BUN: 15 mg/dL (ref 8–27)
Bilirubin Total: 0.7 mg/dL (ref 0.0–1.2)
CO2: 23 mmol/L (ref 20–29)
Calcium: 9.6 mg/dL (ref 8.7–10.3)
Chloride: 101 mmol/L (ref 96–106)
Creatinine, Ser: 0.93 mg/dL (ref 0.57–1.00)
GFR calc Af Amer: 75 mL/min/{1.73_m2} (ref >59)
GFR calc non Af Amer: 65 mL/min/{1.73_m2} (ref >59)
Globulin, Total: 2.4 g/dL (ref 1.5–4.5)
Glucose: 80 mg/dL (ref 65–99)
Potassium: 4.6 mmol/L (ref 3.5–5.2)
Sodium: 139 mmol/L (ref 134–144)
Total Protein: 7 g/dL (ref 6.0–8.5)

## 2020-06-14 LAB — HEMOGLOBIN A1C
Est. average glucose Bld gHb Est-mCnc: 120 mg/dL
Hgb A1c MFr Bld: 5.8 % — ABNORMAL HIGH (ref 4.8–5.6)

## 2020-06-14 LAB — VITAMIN D 25 HYDROXY (VIT D DEFICIENCY, FRACTURES): Vit D, 25-Hydroxy: 40.4 ng/mL (ref 30.0–100.0)

## 2020-06-14 MED ORDER — ROSUVASTATIN CALCIUM 10 MG PO TABS: 10.0000 mg | ORAL_TABLET | Freq: Every day | ORAL | 1 refills | Status: DC

## 2020-06-14 NOTE — Telephone Encounter (Signed)
Pt does not have her vaccination card handy to give the dates however the pt received pfizer vaccine for 1st and 2nd dose and moderna booster

## 2020-07-08 ENCOUNTER — Ambulatory Visit: Payer: PRIVATE HEALTH INSURANCE | Admitting: Dermatology

## 2020-07-08 ENCOUNTER — Other Ambulatory Visit: Payer: Self-pay

## 2020-07-08 ENCOUNTER — Encounter: Payer: Self-pay | Admitting: Dermatology

## 2020-07-08 DIAGNOSIS — L821 Other seborrheic keratosis: Secondary | ICD-10-CM | POA: Diagnosis not present

## 2020-07-08 DIAGNOSIS — L814 Other melanin hyperpigmentation: Secondary | ICD-10-CM | POA: Diagnosis not present

## 2020-07-08 MED ORDER — TRI-LUMA 0.01-4-0.05 % EX CREA: 1.0000 "application " | TOPICAL_CREAM | Freq: Every morning | CUTANEOUS | 2 refills | Status: DC

## 2020-07-18 ENCOUNTER — Encounter: Payer: Self-pay | Admitting: Dermatology

## 2020-07-26 ENCOUNTER — Other Ambulatory Visit: Payer: Self-pay | Admitting: Physician Assistant

## 2020-07-26 DIAGNOSIS — E785 Hyperlipidemia, unspecified: Secondary | ICD-10-CM

## 2020-07-26 DIAGNOSIS — Z0000 Encounter for general adult medical examination without abnormal findings: Secondary | ICD-10-CM

## 2020-07-30 ENCOUNTER — Other Ambulatory Visit: Payer: Self-pay

## 2020-07-30 ENCOUNTER — Other Ambulatory Visit: Payer: PRIVATE HEALTH INSURANCE

## 2020-07-30 DIAGNOSIS — E785 Hyperlipidemia, unspecified: Secondary | ICD-10-CM

## 2020-07-30 DIAGNOSIS — Z0000 Encounter for general adult medical examination without abnormal findings: Secondary | ICD-10-CM

## 2020-07-31 LAB — LIPID PANEL
Chol/HDL Ratio: 3.1 ratio (ref 0.0–4.4)
Cholesterol, Total: 174 mg/dL (ref 100–199)
HDL: 56 mg/dL (ref >39)
LDL Chol Calc (NIH): 101 mg/dL — ABNORMAL HIGH (ref 0–99)
Triglycerides: 90 mg/dL (ref 0–149)
VLDL Cholesterol Cal: 17 mg/dL (ref 5–40)

## 2020-07-31 LAB — HEPATIC FUNCTION PANEL
ALT: 17 IU/L (ref 0–32)
AST: 22 IU/L (ref 0–40)
Albumin: 4.7 g/dL (ref 3.8–4.8)
Alkaline Phosphatase: 70 IU/L (ref 44–121)
Bilirubin Total: 0.4 mg/dL (ref 0.0–1.2)
Bilirubin, Direct: 0.12 mg/dL (ref 0.00–0.40)
Total Protein: 7.2 g/dL (ref 6.0–8.5)

## 2020-08-08 ENCOUNTER — Other Ambulatory Visit: Payer: Self-pay | Admitting: Obstetrics

## 2020-08-08 ENCOUNTER — Ambulatory Visit: Payer: Self-pay | Admitting: Registered Nurse

## 2020-08-08 ENCOUNTER — Other Ambulatory Visit: Payer: Self-pay

## 2020-08-08 VITALS — BP 120/71 | HR 71 | Temp 98.6°F

## 2020-08-08 DIAGNOSIS — N3001 Acute cystitis with hematuria: Secondary | ICD-10-CM

## 2020-08-08 DIAGNOSIS — T148XXA Other injury of unspecified body region, initial encounter: Secondary | ICD-10-CM

## 2020-08-08 DIAGNOSIS — R300 Dysuria: Secondary | ICD-10-CM

## 2020-08-08 DIAGNOSIS — M810 Age-related osteoporosis without current pathological fracture: Secondary | ICD-10-CM

## 2020-08-08 LAB — POCT URINALYSIS DIPSTICK
Bilirubin, UA: NEGATIVE
Glucose, UA: NEGATIVE mg/dL
Ketones, POC UA: NEGATIVE mg/dL
Nitrite, UA: NEGATIVE
Protein Ur, POC: NEGATIVE mg/dL
Specific Gravity, UA: 1.015 (ref 1.005–1.030)
Urobilinogen, UA: 0.2 E.U./dL
pH, UA: 6.5 (ref 5.0–8.0)

## 2020-08-08 NOTE — Progress Notes (Signed)
Subjective:    Patient ID: Monica Wyatt, female    DOB: 04-06-1956, 65 y.o.   MRN: [id number]  64y/o established caucasian female pt c/o suprapubic discomfort, urinary frequency, urinary urgency since this morning. Denied back pain, fever/chills/nausea/vomiting/hand/feet swelling.  Had bactrim left over at home but noticed expired so threw it away.  Last UTI 11/21/2019 treated with bactrim urine culture e coli not resistant to any antibiotics.  Wants me to look at right leg rash popped up after itching lesion/applying lotion to decrease coloring of skin where biopsy taken last year.  It got irritated from me scratching.  Will this pigmen/color every go away?  Denied pruritis prior to scratching area.     Review of Systems  Constitutional: Negative for activity change, appetite change, chills, diaphoresis, fatigue and fever.  HENT: Negative for trouble swallowing and voice change.   Eyes: Negative for photophobia and visual disturbance.  Respiratory: Negative for cough, shortness of breath, wheezing and stridor.   Cardiovascular: Negative for leg swelling.  Gastrointestinal: Positive for abdominal pain. Negative for diarrhea, nausea and vomiting.  Endocrine: Negative for cold intolerance and heat intolerance.  Genitourinary: Positive for frequency and urgency. Negative for flank pain and hematuria.  Musculoskeletal: Negative for back pain, myalgias, neck pain and neck stiffness.  Skin: Negative for rash.  Allergic/Immunologic: Negative for environmental allergies and food allergies.  Neurological: Negative for dizziness, tremors, seizures, syncope, facial asymmetry, speech difficulty, weakness, light-headedness, numbness and headaches.  Hematological: Negative for adenopathy. Does not bruise/bleed easily.  Psychiatric/Behavioral: Negative for agitation, confusion and sleep disturbance.       Objective:   Physical Exam Vitals and nursing note reviewed.  Constitutional:      General:  She is awake. She is not in acute distress.    Appearance: Normal appearance. She is well-developed, well-groomed and normal weight. She is not ill-appearing, toxic-appearing or diaphoretic.  HENT:     Head: Normocephalic and atraumatic.     Jaw: There is normal jaw occlusion.     Right Ear: Hearing and external ear normal.     Left Ear: Hearing and external ear normal.     Nose: Nose normal.     Mouth/Throat:     Pharynx: Oropharynx is clear.  Eyes:     General: Lids are normal. Vision grossly intact. Gaze aligned appropriately. No allergic shiner, visual field deficit or scleral icterus.       Right eye: No discharge.        Left eye: No discharge.     Extraocular Movements: Extraocular movements intact.     Conjunctiva/sclera: Conjunctivae normal.     Pupils: Pupils are equal, round, and reactive to light.  Neck:     Trachea: Trachea and phonation normal. No tracheal deviation.  Cardiovascular:     Rate and Rhythm: Normal rate and regular rhythm.     Pulses: Normal pulses.          Radial pulses are 2+ on the right side and 2+ on the left side.  Pulmonary:     Effort: Pulmonary effort is normal. No respiratory distress.     Breath sounds: Normal breath sounds and air entry. No stridor or transmitted upper airway sounds. No wheezing, rhonchi or rales.     Comments: Spoke full sentences without difficulty; no cough/throat clearing or nasal sniffing in exam room Abdominal:     General: Abdomen is flat. Bowel sounds are normal. There is no distension.     Palpations: Abdomen is  soft. There is no fluid wave, hepatomegaly, splenomegaly, mass or pulsatile mass.     Tenderness: There is abdominal tenderness in the suprapubic area. There is no right CVA tenderness, left CVA tenderness, guarding or rebound.     Hernia: No hernia is present.     Comments: Pressure with palpation suprapubic/urge to urinate  Musculoskeletal:        General: No swelling, tenderness, deformity or signs of  injury. Normal range of motion.     Right shoulder: Normal. No swelling, deformity, effusion or laceration. Normal range of motion.     Left shoulder: Normal. No swelling, deformity, effusion or laceration. Normal range of motion.     Right elbow: Normal. No swelling, deformity, effusion or lacerations. Normal range of motion.     Left elbow: Normal. No swelling, deformity, effusion or lacerations. Normal range of motion.     Right hand: Normal. No swelling, deformity or lacerations. Normal range of motion.     Left hand: Normal. No swelling, deformity or lacerations. Normal range of motion.     Cervical back: Normal, normal range of motion and neck supple. No swelling, edema, deformity, erythema, signs of trauma, lacerations, rigidity, torticollis or crepitus. No pain with movement. Normal range of motion.     Thoracic back: Normal. No swelling, edema, deformity, signs of trauma or lacerations. Normal range of motion.     Lumbar back: Normal. No swelling, edema, deformity, signs of trauma or lacerations. Normal range of motion.     Right lower leg: No edema.     Left lower leg: No edema.     Comments: No bilateral CVA tenderness with palpation or percussion; in/out of chair and on/off exam table without difficulty  Lymphadenopathy:     Head:     Right side of head: No submental or preauricular adenopathy.     Left side of head: No submental or preauricular adenopathy.     Cervical: No cervical adenopathy.     Right cervical: No superficial cervical adenopathy.    Left cervical: No superficial cervical adenopathy.  Skin:    General: Skin is warm and dry.     Capillary Refill: Capillary refill takes less than 2 seconds.     Coloration: Skin is not ashen, cyanotic, jaundiced, mottled, pale or sallow.     Findings: Abrasion and rash present. No abscess, acne, bruising, burn, ecchymosis, erythema, laceration, lesion, petechiae or wound. Rash is macular and papular. Rash is not crusting, nodular,  purpuric, pustular, scaling, urticarial or vesicular.     Nails: There is no clubbing.       Neurological:     General: No focal deficit present.     Mental Status: She is alert and oriented to person, place, and time. Mental status is at baseline.     GCS: GCS eye subscore is 4. GCS verbal subscore is 5. GCS motor subscore is 6.     Cranial Nerves: Cranial nerves are intact. No cranial nerve deficit, dysarthria or facial asymmetry.     Sensory: Sensation is intact. No sensory deficit.     Motor: Motor function is intact. No weakness, tremor, atrophy, abnormal muscle tone or seizure activity.     Coordination: Coordination is intact. Coordination normal.     Gait: Gait is intact. Gait normal.     Comments: Gait sure and steady in clinic; in/out of chair without difficulty; bilateral hand grasp equal 5/5  Psychiatric:        Attention and Perception: Attention  and perception normal.        Mood and Affect: Mood and affect normal.        Speech: Speech normal.        Behavior: Behavior normal. Behavior is cooperative.        Thought Content: Thought content normal.        Cognition and Memory: Cognition and memory normal.        Judgment: Judgment normal.       Discussed dipstick urinalysis results with patient consistent with UTI.  Patient notified urine sent to ruine culture to [facility] may take 72 hours to receive results.  Patient verbalized understanding information/instructions, agreed with plan of care and had no further questions athis time.    Assessment & Plan:  A-acute cystitis with hematuria, abrasion right lower leg  P-Bactrim DS po BID x 3 days #40 RF0 dispensed from [facility] to patient.  Medications as directed. Patient is also to push fluids and may use Pyridium 200mg  po TID as needed. Hydrate, avoid dehydration. Avoid holding urine void on frequent basis every 4 to 6 hours. If unable to void every 8 hours follow up for re-evaluation with PCM, urgent care or ER. Call or  return to clinic as needed if these symptoms worsen or fail to improve as anticipated. Exitcare handout on UTI  Patient verbalized agreement and understanding of treatment plan and had no further questions at this time.  P2: Hydrate with water  Patient was instructed to rest extremities. Do not soak leg in dirty water until abrasion healed avoid pool, lake, hot tub, dirty sink water. May shower apply aquaphor at least daily and keep wounds covered they will heal faster and prevent contamination rubbing from clothing tearing off scabs.  Given aquaphor UD 4 packs from clinic stock change at least daily after washing with soap and water.  Change prn soiling. Exitcare handout on abrasion.  If itching/red may apply hydrocortisone 1% topical BID given 4 UD from clinic stock to patient. Medications as directed. Call or return to clinic as needed if these symptoms worsen or fail to improve as anticipated. Patient verbalized agreement and understanding of treatment plan and had no further questions at this time. P2: ROM, injury prevention

## 2020-08-09 MED ORDER — PHENAZOPYRIDINE HCL 200 MG PO TABS: 200.0000 mg | ORAL_TABLET | Freq: Three times a day (TID) | ORAL | 0 refills | Status: AC

## 2020-08-09 MED ORDER — HYDROCORTISONE 1 % EX LOTN: 1.0000 "application " | TOPICAL_LOTION | Freq: Two times a day (BID) | CUTANEOUS | 0 refills | Status: AC

## 2020-08-09 MED ORDER — SULFAMETHOXAZOLE-TRIMETHOPRIM 800-160 MG PO TABS: 1.0000 | ORAL_TABLET | Freq: Two times a day (BID) | ORAL | 0 refills | Status: DC

## 2020-08-09 MED ORDER — AQUAPHOR EX OINT
TOPICAL_OINTMENT | CUTANEOUS | 0 refills | Status: DC | PRN
Start: 2020-08-09 — End: 2021-05-22

## 2020-08-09 NOTE — Patient Instructions (Signed)
Urinary Tract Infection, Adult  A urinary tract infection (UTI) is an infection of any part of the urinary tract. The urinary tract includes the kidneys, ureters, bladder, and urethra. These organs make, store, and get rid of urine in the body. An upper UTI affects the ureters and kidneys. A lower UTI affects the bladder and urethra. What are the causes? Most urinary tract infections are caused by bacteria in your genital area around your urethra, where urine leaves your body. These bacteria grow and cause inflammation of your urinary tract. What increases the risk? You are more likely to develop this condition if:  You have a urinary catheter that stays in place.  You are not able to control when you urinate or have a bowel movement (incontinence).  You are female and you: ? Use a spermicide or diaphragm for birth control. ? Have low estrogen levels. ? Are pregnant.  You have certain genes that increase your risk.  You are sexually active.  You take antibiotic medicines.  You have a condition that causes your flow of urine to slow down, such as: ? An enlarged prostate, if you are female. ? Blockage in your urethra. ? A kidney stone. ? A nerve condition that affects your bladder control (neurogenic bladder). ? Not getting enough to drink, or not urinating often.  You have certain medical conditions, such as: ? Diabetes. ? A weak disease-fighting system (immunesystem). ? Sickle cell disease. ? Gout. ? Spinal cord injury. What are the signs or symptoms? Symptoms of this condition include:  Needing to urinate right away (urgency).  Frequent urination. This may include small amounts of urine each time you urinate.  Pain or burning with urination.  Blood in the urine.  Urine that smells bad or unusual.  Trouble urinating.  Cloudy urine.  Vaginal discharge, if you are female.  Pain in the abdomen or the lower back. You may also have:  Vomiting or a decreased  appetite.  Confusion.  Irritability or tiredness.  A fever or chills.  Diarrhea. The first symptom in older adults may be confusion. In some cases, they may not have any symptoms until the infection has worsened. How is this diagnosed? This condition is diagnosed based on your medical history and a physical exam. You may also have other tests, including:  Urine tests.  Blood tests.  Tests for STIs (sexually transmitted infections). If you have had more than one UTI, a cystoscopy or imaging studies may be done to determine the cause of the infections. How is this treated? Treatment for this condition includes:  Antibiotic medicine.  Over-the-counter medicines to treat discomfort.  Drinking enough water to stay hydrated. If you have frequent infections or have other conditions such as a kidney stone, you may need to see a health care provider who specializes in the urinary tract (urologist). In rare cases, urinary tract infections can cause sepsis. Sepsis is a life-threatening condition that occurs when the body responds to an infection. Sepsis is treated in the hospital with IV antibiotics, fluids, and other medicines. Follow these instructions at home: Medicines  Take over-the-counter and prescription medicines only as told by your health care provider.  If you were prescribed an antibiotic medicine, take it as told by your health care provider. Do not stop using the antibiotic even if you start to feel better. General instructions  Make sure you: ? Empty your bladder often and completely. Do not hold urine for long periods of time. ? Empty your bladder after   sex. ? Wipe from front to back after urinating or having a bowel movement if you are female. Use each tissue only one time when you wipe.  Drink enough fluid to keep your urine pale yellow.  Keep all follow-up visits. This is important.   Contact a health care provider if:  Your symptoms do not get better after 1-2  days.  Your symptoms go away and then return. Get help right away if:  You have severe pain in your back or your lower abdomen.  You have a fever or chills.  You have nausea or vomiting. Summary  A urinary tract infection (UTI) is an infection of any part of the urinary tract, which includes the kidneys, ureters, bladder, and urethra.  Most urinary tract infections are caused by bacteria in your genital area.  Treatment for this condition often includes antibiotic medicines.  If you were prescribed an antibiotic medicine, take it as told by your health care provider. Do not stop using the antibiotic even if you start to feel better.  Keep all follow-up visits. This is important. This information is not intended to replace advice given to you by your health care provider. Make sure you discuss any questions you have with your health care provider. Document Revised: 12/08/2019 Document Reviewed: 12/08/2019 Elsevier Patient Education  2021 [employer]  

## 2020-08-10 LAB — URINE CULTURE

## 2020-08-11 ENCOUNTER — Telehealth: Payer: Self-pay | Admitting: Registered Nurse

## 2020-08-11 NOTE — Telephone Encounter (Signed)
Patient read my chart message for urine culture results.  She asked if caffeine could be causing her symptoms.  Discussed caffeine and some foods can cause bladder irritation/urethral irritation.  Many urologists recommend cutting down caffeine intake with aging and discussed hormonal changes can also worsen symptoms in women with aging.  Patient stopped bactrim DS.  Patient pushing water intake.  Patient to follow up with RN Hildred Alamin or I if new or worsening symptoms for repeat dipstick urinalysis/culture.  Patient verbalized understanding information/instructions, agreed with plan of care and had no further questions at this time.

## 2020-08-15 ENCOUNTER — Other Ambulatory Visit: Payer: Self-pay | Admitting: Physician Assistant

## 2020-08-15 DIAGNOSIS — E785 Hyperlipidemia, unspecified: Secondary | ICD-10-CM

## 2020-08-18 NOTE — Progress Notes (Signed)
   Follow-Up Visit   Subjective  Monica Wyatt is a 65 y.o. female who presents for the following: Follow-up (Per patient follow up on previously scraping on right leg. Spot on left cheek small triangle, really dry, crusty, x year no color change).  Recheck treated spot right leg and new crust left cheek Location:  Duration:  Quality:  Associated Signs/Symptoms: Modifying Factors:  Severity:  Timing: Context:   Objective  Well appearing patient in no apparent distress; mood and affect are within normal limits. Objective  Left  Buccal Cheek: Subtle light brown 3 mm textured flat papule  Objective  Right Lower Leg - Anterior: Hyperpigmented macule, likely postinflammatory hyperpigmentation (dermoscopy shows no sign of melanocytic lesion).    All sun exposed areas plus back examined.   Assessment & Plan    Seborrheic keratosis Left  Buccal Cheek  Benign nature explained to [facility] along with the option of leaving the spot as long as it was stable.  She did request something be done.  Destruction of lesion - Left  Buccal Cheek Complexity: simple   Destruction method: cryotherapy   Informed consent: discussed and consent obtained   Timeout:  patient name, date of birth, surgical site, and procedure verified Lesion destroyed using liquid nitrogen: Yes   Cryotherapy cycles:  3 Outcome: patient tolerated procedure well with no complications    Darkening of skin Right Lower Leg - Anterior  Treatment options reviewed including the possibility of a topical therapy would not like the spot.  She did want to at least try.  Prescription given for generic Tri-Luma, to be applied nightly for 10 weeks.  If there is improvement, she will need to use a #50+ SPF anytime the area is exposed.  Fluocin-Hydroquinone-Tretinoin (TRI-LUMA) 0.01-4-0.05 % CREA - Right Lower Leg - Anterior      I, Monica Monarch, MD, have reviewed all documentation for this visit.  The documentation on  08/18/20 for the exam, diagnosis, procedures, and orders are all accurate and complete.

## 2020-09-02 ENCOUNTER — Telehealth: Payer: Self-pay | Admitting: Dermatology

## 2020-09-02 MED ORDER — HYDROQUINONE 4 % EX CREA: TOPICAL_CREAM | Freq: Every day | CUTANEOUS | 0 refills | Status: DC

## 2020-09-02 NOTE — Telephone Encounter (Signed)
Patient is calling to say that she couldn't afford the medication for leg ($250.00+).  Patient talked with pharmacist and he recommended Hydroquinone 4% and that would be about $50.00.

## 2020-09-02 NOTE — Telephone Encounter (Signed)
Per Dr Denna Haggard ok to change the medication to 4% hydroquinone daily due to insurance wont cover the medication we prescribed

## 2020-10-14 ENCOUNTER — Other Ambulatory Visit: Payer: Self-pay | Admitting: Physician Assistant

## 2020-10-14 DIAGNOSIS — E785 Hyperlipidemia, unspecified: Secondary | ICD-10-CM

## 2020-12-12 ENCOUNTER — Other Ambulatory Visit: Payer: Self-pay

## 2020-12-12 ENCOUNTER — Encounter: Payer: Self-pay | Admitting: Physician Assistant

## 2020-12-12 ENCOUNTER — Ambulatory Visit (INDEPENDENT_AMBULATORY_CARE_PROVIDER_SITE_OTHER): Payer: No Typology Code available for payment source | Admitting: Physician Assistant

## 2020-12-12 VITALS — BP 110/69 | HR 65 | Temp 97.5°F | Ht 63.0 in | Wt 129.0 lb

## 2020-12-12 DIAGNOSIS — E785 Hyperlipidemia, unspecified: Secondary | ICD-10-CM | POA: Diagnosis not present

## 2020-12-12 DIAGNOSIS — Z1211 Encounter for screening for malignant neoplasm of colon: Secondary | ICD-10-CM

## 2020-12-12 DIAGNOSIS — R7303 Prediabetes: Secondary | ICD-10-CM | POA: Diagnosis not present

## 2020-12-12 DIAGNOSIS — Z0000 Encounter for general adult medical examination without abnormal findings: Secondary | ICD-10-CM

## 2020-12-12 DIAGNOSIS — E559 Vitamin D deficiency, unspecified: Secondary | ICD-10-CM | POA: Diagnosis not present

## 2020-12-12 NOTE — Progress Notes (Signed)
Subjective:     Monica Wyatt is a 65 y.o. female and is here for a comprehensive physical exam. The patient reports no problems.  Social History   Socioeconomic History   Marital status: Married    Spouse name: Not on file   Number of children: Not on file   Years of education: Not on file   Highest education level: Not on file  Occupational History   Not on file  Tobacco Use   Smoking status: Never   Smokeless tobacco: Never  Vaping Use   Vaping Use: Never used  Substance and Sexual Activity   Alcohol use: No   Drug use: No   Sexual activity: Yes    Birth control/protection: Surgical  Other Topics Concern   Not on file  Social History Narrative   Not on file   Social Determinants of Health   Financial Resource Strain: Not on file  Food Insecurity: Not on file  Transportation Needs: Not on file  Physical Activity: Not on file  Stress: Not on file  Social Connections: Not on file  Intimate Partner Violence: Not on file   Health Maintenance  Topic Date Due   Pneumococcal Vaccine 60-33 Years old (1 - PCV) Never done   HIV Screening  Never done   Hepatitis C Screening  Never done   Zoster Vaccines- Shingrix (1 of 2) Never done   PAP SMEAR-Modifier  Never done   COLONOSCOPY (Pts 45-52yr Insurance coverage will need to be confirmed)  Never done   COVID-19 Vaccine (4 - Booster) 08/19/2020   INFLUENZA VACCINE  12/09/2020   MAMMOGRAM  01/09/2021   TETANUS/TDAP  02/06/2027   HPV VACCINES  Aged Out    The following portions of the patient's history were reviewed and updated as appropriate: allergies, current medications, past family history, past medical history, past social history, past surgical history, and problem list.  Review of Systems Pertinent items noted in HPI and remainder of comprehensive ROS otherwise negative.   Objective:    BP 110/69   Pulse 65   Temp (!) 97.5 F (36.4 C)   Ht '5\' 3"'$  (1.6 m)   Wt 129 lb (58.5 kg)   SpO2 96%   BMI 22.85 kg/m   General appearance: alert, cooperative, and no distress Head: Normocephalic, without obvious abnormality, atraumatic Eyes: conjunctivae/corneas clear. PERRL, EOM's intact. Fundi benign. Ears: normal TM's and external ear canals both ears Nose: Nares normal. Septum midline. Mucosa normal. No drainage or sinus tenderness. Throat: lips, mucosa, and tongue normal; teeth and gums normal Neck: no adenopathy, no carotid bruit, no JVD, supple, symmetrical, trachea midline, and thyroid not enlarged, symmetric, no tenderness/mass/nodules Back: symmetric, no curvature. ROM normal. No CVA tenderness. Lungs: clear to auscultation bilaterally Heart: regular rate and rhythm and S1, S2 normal Abdomen: soft, non-tender; bowel sounds normal; no masses,  no organomegaly Extremities: extremities normal, atraumatic, no cyanosis or edema Pulses: 2+ and symmetric Skin: Skin color, texture, turgor normal. No rashes or lesions Lymph nodes: Cervical adenopathy: normal and Supraclavicular adenopathy: normal Neurologic: Grossly normal    Assessment:    Healthy female exam.      Plan:  -Will obtain fasting labs. -[facility], bone density.  Will request most recent records. -Will place order for Cologuard.  Declined immunizations. -Recommend to follow a heart healthy diet and continue to stay as active as possible.  Stay well-hydrated. -Follow-up in 6 months for regular OV: HLD, prediabetes, vitamin D  See After Visit Summary for Counseling Recommendations

## 2020-12-13 LAB — CBC WITH DIFFERENTIAL/PLATELET
Basophils Absolute: 0 10*3/uL (ref 0.0–0.2)
Basos: 1 %
EOS (ABSOLUTE): 0.1 10*3/uL (ref 0.0–0.4)
Eos: 1 %
Hematocrit: 39.8 % (ref 34.0–46.6)
Hemoglobin: 13.4 g/dL (ref 11.1–15.9)
Immature Grans (Abs): 0 10*3/uL (ref 0.0–0.1)
Immature Granulocytes: 1 %
Lymphocytes Absolute: 1.7 10*3/uL (ref 0.7–3.1)
Lymphs: 32 %
MCH: 31.4 pg (ref 26.6–33.0)
MCHC: 33.7 g/dL (ref 31.5–35.7)
MCV: 93 fL (ref 79–97)
Monocytes Absolute: 0.6 10*3/uL (ref 0.1–0.9)
Monocytes: 11 %
Neutrophils Absolute: 2.8 10*3/uL (ref 1.4–7.0)
Neutrophils: 54 %
Platelets: 227 10*3/uL (ref 150–450)
RBC: 4.27 x10E6/uL (ref 3.77–5.28)
RDW: 12.4 % (ref 11.7–15.4)
WBC: 5.2 10*3/uL (ref 3.4–10.8)

## 2020-12-13 LAB — LIPID PANEL
Chol/HDL Ratio: 2.8 ratio (ref 0.0–4.4)
Cholesterol, Total: 147 mg/dL (ref 100–199)
HDL: 52 mg/dL (ref >39)
LDL Chol Calc (NIH): 78 mg/dL (ref 0–99)
Triglycerides: 91 mg/dL (ref 0–149)
VLDL Cholesterol Cal: 17 mg/dL (ref 5–40)

## 2020-12-13 LAB — COMPREHENSIVE METABOLIC PANEL
ALT: 13 IU/L (ref 0–32)
AST: 22 IU/L (ref 0–40)
Albumin/Globulin Ratio: 2.4 — ABNORMAL HIGH (ref 1.2–2.2)
Albumin: 4.6 g/dL (ref 3.8–4.8)
Alkaline Phosphatase: 59 IU/L (ref 44–121)
BUN/Creatinine Ratio: 15 (ref 12–28)
BUN: 15 mg/dL (ref 8–27)
Bilirubin Total: 0.5 mg/dL (ref 0.0–1.2)
CO2: 23 mmol/L (ref 20–29)
Calcium: 9.4 mg/dL (ref 8.7–10.3)
Chloride: 104 mmol/L (ref 96–106)
Creatinine, Ser: 0.99 mg/dL (ref 0.57–1.00)
Globulin, Total: 1.9 g/dL (ref 1.5–4.5)
Glucose: 81 mg/dL (ref 65–99)
Potassium: 4.6 mmol/L (ref 3.5–5.2)
Sodium: 143 mmol/L (ref 134–144)
Total Protein: 6.5 g/dL (ref 6.0–8.5)
eGFR: 64 mL/min/{1.73_m2} (ref >59)

## 2020-12-13 LAB — HEMOGLOBIN A1C
Est. average glucose Bld gHb Est-mCnc: 128 mg/dL
Hgb A1c MFr Bld: 6.1 % — ABNORMAL HIGH (ref 4.8–5.6)

## 2020-12-13 LAB — TSH: TSH: 2.86 u[IU]/mL (ref 0.450–4.500)

## 2020-12-13 LAB — VITAMIN D 25 HYDROXY (VIT D DEFICIENCY, FRACTURES): Vit D, 25-Hydroxy: 38.2 ng/mL (ref 30.0–100.0)

## 2020-12-26 ENCOUNTER — Telehealth: Payer: Self-pay | Admitting: Physician Assistant

## 2020-12-26 NOTE — Telephone Encounter (Signed)
Patient is aware the results have not come back in but that we will contact her when the provider has reviewed. AS, CMA

## 2020-12-26 NOTE — Telephone Encounter (Signed)
Patient would like to know if her colorguard results are back in. Thanks

## 2020-12-31 LAB — COLOGUARD: Cologuard: NEGATIVE

## 2021-01-15 ENCOUNTER — Other Ambulatory Visit: Payer: Self-pay | Admitting: Physician Assistant

## 2021-01-15 DIAGNOSIS — E785 Hyperlipidemia, unspecified: Secondary | ICD-10-CM

## 2021-01-29 LAB — HM MAMMOGRAPHY

## 2021-02-04 ENCOUNTER — Encounter: Payer: Self-pay | Admitting: Physician Assistant

## 2021-03-17 DIAGNOSIS — Z820 Family history of epilepsy and other diseases of the nervous system: Secondary | ICD-10-CM | POA: Diagnosis not present

## 2021-03-17 DIAGNOSIS — Z880 Allergy status to penicillin: Secondary | ICD-10-CM | POA: Diagnosis not present

## 2021-03-17 DIAGNOSIS — Z801 Family history of malignant neoplasm of trachea, bronchus and lung: Secondary | ICD-10-CM | POA: Diagnosis not present

## 2021-03-17 DIAGNOSIS — E785 Hyperlipidemia, unspecified: Secondary | ICD-10-CM | POA: Diagnosis not present

## 2021-03-17 DIAGNOSIS — Z7722 Contact with and (suspected) exposure to environmental tobacco smoke (acute) (chronic): Secondary | ICD-10-CM | POA: Diagnosis not present

## 2021-03-17 DIAGNOSIS — M810 Age-related osteoporosis without current pathological fracture: Secondary | ICD-10-CM | POA: Diagnosis not present

## 2021-03-17 DIAGNOSIS — Z008 Encounter for other general examination: Secondary | ICD-10-CM | POA: Diagnosis not present

## 2021-03-18 DIAGNOSIS — I83891 Varicose veins of right lower extremities with other complications: Secondary | ICD-10-CM | POA: Diagnosis not present

## 2021-03-18 DIAGNOSIS — I872 Venous insufficiency (chronic) (peripheral): Secondary | ICD-10-CM | POA: Diagnosis not present

## 2021-03-24 DIAGNOSIS — I83811 Varicose veins of right lower extremities with pain: Secondary | ICD-10-CM | POA: Diagnosis not present

## 2021-03-24 DIAGNOSIS — Z09 Encounter for follow-up examination after completed treatment for conditions other than malignant neoplasm: Secondary | ICD-10-CM | POA: Diagnosis not present

## 2021-04-01 DIAGNOSIS — I83891 Varicose veins of right lower extremities with other complications: Secondary | ICD-10-CM | POA: Diagnosis not present

## 2021-04-01 DIAGNOSIS — I87321 Chronic venous hypertension (idiopathic) with inflammation of right lower extremity: Secondary | ICD-10-CM | POA: Diagnosis not present

## 2021-04-01 DIAGNOSIS — I87391 Chronic venous hypertension (idiopathic) with other complications of right lower extremity: Secondary | ICD-10-CM | POA: Diagnosis not present

## 2021-04-01 DIAGNOSIS — Z09 Encounter for follow-up examination after completed treatment for conditions other than malignant neoplasm: Secondary | ICD-10-CM | POA: Diagnosis not present

## 2021-04-17 DIAGNOSIS — M7989 Other specified soft tissue disorders: Secondary | ICD-10-CM | POA: Diagnosis not present

## 2021-04-17 DIAGNOSIS — I83891 Varicose veins of right lower extremities with other complications: Secondary | ICD-10-CM | POA: Diagnosis not present

## 2021-04-17 DIAGNOSIS — I83811 Varicose veins of right lower extremities with pain: Secondary | ICD-10-CM | POA: Diagnosis not present

## 2021-05-21 DIAGNOSIS — I83891 Varicose veins of right lower extremities with other complications: Secondary | ICD-10-CM | POA: Diagnosis not present

## 2021-05-22 ENCOUNTER — Encounter: Payer: Self-pay | Admitting: Family Medicine

## 2021-05-22 ENCOUNTER — Ambulatory Visit (INDEPENDENT_AMBULATORY_CARE_PROVIDER_SITE_OTHER): Payer: Medicare HMO | Admitting: Family Medicine

## 2021-05-22 VITALS — BP 118/66 | HR 63 | Temp 98.1°F | Resp 16 | Ht 65.0 in | Wt 135.0 lb

## 2021-05-22 DIAGNOSIS — R7303 Prediabetes: Secondary | ICD-10-CM

## 2021-05-22 DIAGNOSIS — Z8616 Personal history of COVID-19: Secondary | ICD-10-CM

## 2021-05-22 DIAGNOSIS — E785 Hyperlipidemia, unspecified: Secondary | ICD-10-CM

## 2021-05-22 DIAGNOSIS — G4700 Insomnia, unspecified: Secondary | ICD-10-CM

## 2021-05-22 DIAGNOSIS — Z23 Encounter for immunization: Secondary | ICD-10-CM

## 2021-05-22 DIAGNOSIS — M810 Age-related osteoporosis without current pathological fracture: Secondary | ICD-10-CM | POA: Diagnosis not present

## 2021-05-22 NOTE — Patient Instructions (Addendum)
Plan on follow up with fasting labs in 1 month.  Melatonin is an option for sleep if needed - see info on sleep below.  Ok to wait until March for covid booster if you want to have that given.  Pneumonia vaccine today.  Insomnia Insomnia is a sleep disorder that makes it difficult to fall asleep or stay asleep. Insomnia can cause fatigue, low energy, difficulty concentrating, mood swings, and poor performance at work or school. There are three different ways to classify insomnia: Difficulty falling asleep. Difficulty staying asleep. Waking up too early in the morning. Any type of insomnia can be long-term (chronic) or short-term (acute). Both are common. Short-term insomnia usually lasts for three months or less. Chronic insomnia occurs at least three times a week for longer than three months. What are the causes? Insomnia may be caused by another condition, situation, or substance, such as: Anxiety. Certain medicines. Gastroesophageal reflux disease (GERD) or other gastrointestinal conditions. Asthma or other breathing conditions. Restless legs syndrome, sleep apnea, or other sleep disorders. Chronic pain. Menopause. Stroke. Abuse of alcohol, tobacco, or illegal drugs. Mental health conditions, such as depression. Caffeine. Neurological disorders, such as Alzheimer's disease. An overactive thyroid (hyperthyroidism). Sometimes, the cause of insomnia may not be known. What increases the risk? Risk factors for insomnia include: Gender. Women are affected more often than men. Age. Insomnia is more common as you get older. Stress. Lack of exercise. Irregular work schedule or working night shifts. Traveling between different time zones. Certain medical and mental health conditions. What are the signs or symptoms? If you have insomnia, the main symptom is having trouble falling asleep or having trouble staying asleep. This may lead to other symptoms, such as: Feeling fatigued or  having low energy. Feeling nervous about going to sleep. Not feeling rested in the morning. Having trouble concentrating. Feeling irritable, anxious, or depressed. How is this diagnosed? This condition may be diagnosed based on: Your symptoms and medical history. Your health care provider may ask about: Your sleep habits. Any medical conditions you have. Your mental health. A physical exam. How is this treated? Treatment for insomnia depends on the cause. Treatment may focus on treating an underlying condition that is causing insomnia. Treatment may also include: Medicines to help you sleep. Counseling or therapy. Lifestyle adjustments to help you sleep better. Follow these instructions at home: Eating and drinking  Limit or avoid alcohol, caffeinated beverages, and cigarettes, especially close to bedtime. These can disrupt your sleep. Do not eat a large meal or eat spicy foods right before bedtime. This can lead to digestive discomfort that can make it hard for you to sleep. Sleep habits  Keep a sleep diary to help you and your health care provider figure out what could be causing your insomnia. Write down: When you sleep. When you wake up during the night. How well you sleep. How rested you feel the next day. Any side effects of medicines you are taking. What you eat and drink. Make your bedroom a dark, comfortable place where it is easy to fall asleep. Put up shades or blackout curtains to block light from outside. Use a white noise machine to block noise. Keep the temperature cool. Limit screen use before bedtime. This includes: Watching TV. Using your smartphone, tablet, or computer. Stick to a routine that includes going to bed and waking up at the same times every day and night. This can help you fall asleep faster. Consider making a quiet activity, such as reading,  part of your nighttime routine. Try to avoid taking naps during the day so that you sleep better at  night. Get out of bed if you are still awake after 15 minutes of trying to sleep. Keep the lights down, but try reading or doing a quiet activity. When you feel sleepy, go back to bed. General instructions Take over-the-counter and prescription medicines only as told by your health care provider. Exercise regularly, as told by your health care provider. Avoid exercise starting several hours before bedtime. Use relaxation techniques to manage stress. Ask your health care provider to suggest some techniques that may work well for you. These may include: Breathing exercises. Routines to release muscle tension. Visualizing peaceful scenes. Make sure that you drive carefully. Avoid driving if you feel very sleepy. Keep all follow-up visits as told by your health care provider. This is important. Contact a health care provider if: You are tired throughout the day. You have trouble in your daily routine due to sleepiness. You continue to have sleep problems, or your sleep problems get worse. Get help right away if: You have serious thoughts about hurting yourself or someone else. If you ever feel like you may hurt yourself or others, or have thoughts about taking your own life, get help right away. You can go to your nearest emergency department or call: Your local emergency services (911 in the [place] A suicide crisis helpline, such as the [place] at 484-557-1385 or 988 in the [place]. This is open 24 hours a day. Summary Insomnia is a sleep disorder that makes it difficult to fall asleep or stay asleep. Insomnia can be long-term (chronic) or short-term (acute). Treatment for insomnia depends on the cause. Treatment may focus on treating an underlying condition that is causing insomnia. Keep a sleep diary to help you and your health care provider figure out what could be causing your insomnia. This information is not intended to replace advice given to you by your health  care provider. Make sure you discuss any questions you have with your health care provider. Document Revised: 11/20/2020 Document Reviewed: 03/07/2020 [employer] Patient Education  2022 [employer].

## 2021-05-22 NOTE — Progress Notes (Signed)
Subjective:  Patient ID: Monica Wyatt, female    DOB: 1955/09/21  Age: 66 y.o. MRN: [id number]  CC:  Chief Complaint  Patient presents with   New Patient (Initial Visit)    Pt reports inconsistent PCP and is looking to establish with long term provider     HPI Monica Wyatt presents for  New patient to establish care. Husband also patient of mine.  Prior ELF:YBOFBP, Ranell Patrick, MD GYN - Dr. [name].   Prediabetes: No current meds, last labs 5 months ago. No sugar beverages.  Riding bicycle, exercise  bike on occasion.   Lab Results  Component Value Date   HGBA1C 6.1 (H) 12/12/2020   Wt Readings from Last 3 Encounters:  05/22/21 135 lb (61.2 kg)  12/12/20 129 lb (58.5 kg)  06/13/20 127 lb 3.2 oz (57.7 kg)     Hyperlipidemia: Crestor 10mg  QD, lower dose, no new side effects.  Lab Results  Component Value Date   CHOL 147 12/12/2020   HDL 52 12/12/2020   LDLCALC 78 12/12/2020   TRIG 91 12/12/2020   CHOLHDL 2.8 12/12/2020   Lab Results  Component Value Date   ALT 13 12/12/2020   AST 22 12/12/2020   GGT 11 02/17/2016   ALKPHOS 59 12/12/2020   BILITOT 0.5 12/12/2020   Insomnia Occasional insomnia, no meds. No specific treatments- up to 2-3 times per week.   Osteoporosis Followed by GYN - Dr [name], s/p Fosamax for 5 years, and discontinued.  UTD on pap testing with GYN.   Covid infection Few weeks ago. Negative testing, symptoms improved, cough better.  Most recent booster before September.  Immunization History  Administered Date(s) Administered   Influenza-Unspecified 02/22/2020, 04/22/2021   Moderna Sars-Covid-2 Vaccination 05/21/2020   PFIZER(Purple Top)SARS-COV-2 Vaccination 08/31/2019, 09/21/2019   Tdap 02/05/2017  Pneumonia vaccine - today.           History Patient Active Problem List   Diagnosis Date Noted   Healthcare maintenance 10/14/2017   Insomnia 10/14/2017   Pre-diabetes 10/14/2017   Screening for colon cancer 10/14/2017    Osteoporosis 03/18/2017   Hyperlipidemia 06/08/2016   Chronic tension-type headache, not intractable 06/08/2016   Past Medical History:  Diagnosis Date   GERD (gastroesophageal reflux disease)    Headache(784.0)    Hyperlipidemia    Medical history non-contributory    Osteoporosis    Past Surgical History:  Procedure Laterality Date   HERNIA REPAIR     HYSTEROSCOPY WITH D & C N/A 07/22/2012   Procedure: DILATATION AND CURETTAGE /HYSTEROSCOPY;  Surgeon: [name]. [name], MD;  Location: [facility];  Service: Gynecology;  Laterality: N/A;   LAPAROSCOPY N/A 07/22/2012   Procedure: LAPAROSCOPY OPERATIVE;  Surgeon: [name] [name], MD;  Location: [facility];  Service: Gynecology;  Laterality: N/A;   NO PAST SURGERIES     SALPINGOOPHORECTOMY Bilateral 07/22/2012   Procedure: SALPINGO OOPHORECTOMY;  Surgeon: [name] [name], MD;  Location: [facility];  Service: Gynecology;  Laterality: Bilateral;   Allergies  Allergen Reactions   Penicillins Hives    Childhood reaction   Prior to Admission medications   Medication Sig Start Date End Date Taking? Authorizing Provider  Cholecalciferol (VITAMIN D3) 2000 units TABS Take 1 tablet by mouth daily.   Yes [provider]  rosuvastatin (CRESTOR) 10 MG tablet TAKE 1 TABLET (10 MG TOTAL) BY MOUTH DAILY. 01/15/21  Yes Lorrene Reid, PA-C   Social History   Socioeconomic History   Marital status: Married  Spouse name: Not on file   Number of children: Not on file   Years of education: Not on file   Highest education level: Not on file  Occupational History   Not on file  Tobacco Use   Smoking status: Never   Smokeless tobacco: Never  Vaping Use   Vaping Use: Never used  Substance and Sexual Activity   Alcohol use: Not Currently   Drug use: No   Sexual activity: Not Currently    Birth control/protection: Surgical  Other Topics Concern   Not on file  Social History Narrative   Not on file   Social Determinants of Health    Financial Resource Strain: Not on file  Food Insecurity: Not on file  Transportation Needs: Not on file  Physical Activity: Not on file  Stress: Not on file  Social Connections: Not on file  Intimate Partner Violence: Not on file    Review of Systems  Constitutional:  Negative for fatigue and unexpected weight change.  Respiratory:  Negative for chest tightness and shortness of breath.   Cardiovascular:  Negative for chest pain, palpitations and leg swelling.  Gastrointestinal:  Negative for abdominal pain and blood in stool.  Neurological:  Negative for dizziness, syncope, light-headedness and headaches.    Objective:   Vitals:   05/22/21 1545  BP: 118/66  Pulse: 63  Resp: 16  Temp: 98.1 F (36.7 C)  TempSrc: Temporal  SpO2: 97%  Weight: 135 lb (61.2 kg)  Height: 5\' 5"  (1.651 m)     Physical Exam Vitals reviewed.  Constitutional:      Appearance: Normal appearance. She is well-developed.  HENT:     Head: Normocephalic and atraumatic.  Eyes:     Conjunctiva/sclera: Conjunctivae normal.     Pupils: Pupils are equal, round, and reactive to light.  Neck:     Vascular: No carotid bruit.  Cardiovascular:     Rate and Rhythm: Normal rate and regular rhythm.     Heart sounds: Normal heart sounds.  Pulmonary:     Effort: Pulmonary effort is normal.     Breath sounds: Normal breath sounds.  Abdominal:     Palpations: Abdomen is soft. There is no pulsatile mass.     Tenderness: There is no abdominal tenderness.  Musculoskeletal:     Right lower leg: No edema.     Left lower leg: No edema.  Skin:    General: Skin is warm and dry.  Neurological:     Mental Status: She is alert and oriented to person, place, and time.  Psychiatric:        Mood and Affect: Mood normal.        Behavior: Behavior normal.    Assessment & Plan:  Monica Wyatt is a 66 y.o. female . Pre-diabetes No current meds.  Diet/exercise approach.  Plan on 1 month follow-up for fasting labs  at that time.  Need for pneumococcal vaccination - Plan: Pneumococcal conjugate vaccine 20-valent (Prevnar 20)  -Prevnar 20 given  Age-related osteoporosis without current pathological fracture  -Status posttreatment for 5 years with bisphosphonate, now off meds, followed by gynecology with ongoing bone density screening.  Insomnia, unspecified type  -Intermittent symptoms.  Handout given on sleep hygiene, option of low-dose melatonin, can also recheck at follow-up in 1 month  Hyperlipidemia, unspecified hyperlipidemia type Tolerating current regimen with reported lower dose of rosuvastatin.  Fasting labs next month  History of COVID infection as above, recovered.  Bivalent COVID-vaccine booster discussed  and timing.  No orders of the defined types were placed in this encounter.  Patient Instructions  Plan on follow up with fasting labs in 1 month.  Melatonin is an option for sleep if needed - see info on sleep below.  Ok to wait until March for covid booster if you want to have that given.  Pneumonia vaccine today.  Insomnia Insomnia is a sleep disorder that makes it difficult to fall asleep or stay asleep. Insomnia can cause fatigue, low energy, difficulty concentrating, mood swings, and poor performance at work or school. There are three different ways to classify insomnia: Difficulty falling asleep. Difficulty staying asleep. Waking up too early in the morning. Any type of insomnia can be long-term (chronic) or short-term (acute). Both are common. Short-term insomnia usually lasts for three months or less. Chronic insomnia occurs at least three times a week for longer than three months. What are the causes? Insomnia may be caused by another condition, situation, or substance, such as: Anxiety. Certain medicines. Gastroesophageal reflux disease (GERD) or other gastrointestinal conditions. Asthma or other breathing conditions. Restless legs syndrome, sleep apnea, or other sleep  disorders. Chronic pain. Menopause. Stroke. Abuse of alcohol, tobacco, or illegal drugs. Mental health conditions, such as depression. Caffeine. Neurological disorders, such as Alzheimer's disease. An overactive thyroid (hyperthyroidism). Sometimes, the cause of insomnia may not be known. What increases the risk? Risk factors for insomnia include: Gender. Women are affected more often than men. Age. Insomnia is more common as you get older. Stress. Lack of exercise. Irregular work schedule or working night shifts. Traveling between different time zones. Certain medical and mental health conditions. What are the signs or symptoms? If you have insomnia, the main symptom is having trouble falling asleep or having trouble staying asleep. This may lead to other symptoms, such as: Feeling fatigued or having low energy. Feeling nervous about going to sleep. Not feeling rested in the morning. Having trouble concentrating. Feeling irritable, anxious, or depressed. How is this diagnosed? This condition may be diagnosed based on: Your symptoms and medical history. Your health care provider may ask about: Your sleep habits. Any medical conditions you have. Your mental health. A physical exam. How is this treated? Treatment for insomnia depends on the cause. Treatment may focus on treating an underlying condition that is causing insomnia. Treatment may also include: Medicines to help you sleep. Counseling or therapy. Lifestyle adjustments to help you sleep better. Follow these instructions at home: Eating and drinking  Limit or avoid alcohol, caffeinated beverages, and cigarettes, especially close to bedtime. These can disrupt your sleep. Do not eat a large meal or eat spicy foods right before bedtime. This can lead to digestive discomfort that can make it hard for you to sleep. Sleep habits  Keep a sleep diary to help you and your health care provider figure out what could be causing  your insomnia. Write down: When you sleep. When you wake up during the night. How well you sleep. How rested you feel the next day. Any side effects of medicines you are taking. What you eat and drink. Make your bedroom a dark, comfortable place where it is easy to fall asleep. Put up shades or blackout curtains to block light from outside. Use a white noise machine to block noise. Keep the temperature cool. Limit screen use before bedtime. This includes: Watching TV. Using your smartphone, tablet, or computer. Stick to a routine that includes going to bed and waking up at the same times  every day and night. This can help you fall asleep faster. Consider making a quiet activity, such as reading, part of your nighttime routine. Try to avoid taking naps during the day so that you sleep better at night. Get out of bed if you are still awake after 15 minutes of trying to sleep. Keep the lights down, but try reading or doing a quiet activity. When you feel sleepy, go back to bed. General instructions Take over-the-counter and prescription medicines only as told by your health care provider. Exercise regularly, as told by your health care provider. Avoid exercise starting several hours before bedtime. Use relaxation techniques to manage stress. Ask your health care provider to suggest some techniques that may work well for you. These may include: Breathing exercises. Routines to release muscle tension. Visualizing peaceful scenes. Make sure that you drive carefully. Avoid driving if you feel very sleepy. Keep all follow-up visits as told by your health care provider. This is important. Contact a health care provider if: You are tired throughout the day. You have trouble in your daily routine due to sleepiness. You continue to have sleep problems, or your sleep problems get worse. Get help right away if: You have serious thoughts about hurting yourself or someone else. If you ever feel like  you may hurt yourself or others, or have thoughts about taking your own life, get help right away. You can go to your nearest emergency department or call: Your local emergency services (911 in the [place] A suicide crisis helpline, such as the [facility] at 782 306 3059 or 988 in the [place]. This is open 24 hours a day. Summary Insomnia is a sleep disorder that makes it difficult to fall asleep or stay asleep. Insomnia can be long-term (chronic) or short-term (acute). Treatment for insomnia depends on the cause. Treatment may focus on treating an underlying condition that is causing insomnia. Keep a sleep diary to help you and your health care provider figure out what could be causing your insomnia. This information is not intended to replace advice given to you by your health care provider. Make sure you discuss any questions you have with your health care provider. Document Revised: 11/20/2020 Document Reviewed: 03/07/2020 Elsevier Patient Education  2022 [place],   Merri Ray, MD [place] 05/22/21 3:56 PM

## 2021-06-04 DIAGNOSIS — I87391 Chronic venous hypertension (idiopathic) with other complications of right lower extremity: Secondary | ICD-10-CM | POA: Diagnosis not present

## 2021-06-04 DIAGNOSIS — I83891 Varicose veins of right lower extremities with other complications: Secondary | ICD-10-CM | POA: Diagnosis not present

## 2021-06-16 ENCOUNTER — Ambulatory Visit: Payer: No Typology Code available for payment source | Admitting: Physician Assistant

## 2021-06-20 ENCOUNTER — Telehealth: Payer: Self-pay

## 2021-06-20 DIAGNOSIS — E785 Hyperlipidemia, unspecified: Secondary | ICD-10-CM

## 2021-06-20 DIAGNOSIS — R7303 Prediabetes: Secondary | ICD-10-CM

## 2021-06-20 NOTE — Telephone Encounter (Signed)
Pt has lab only visit for Monday, no orders in please advise what should be ordered for her

## 2021-06-20 NOTE — Telephone Encounter (Signed)
Orders entered

## 2021-06-20 NOTE — Addendum Note (Signed)
Addended by: Merri Ray R on: 06/20/2021 03:29 PM   Modules accepted: Orders

## 2021-06-23 ENCOUNTER — Other Ambulatory Visit (INDEPENDENT_AMBULATORY_CARE_PROVIDER_SITE_OTHER): Payer: Medicare HMO

## 2021-06-23 DIAGNOSIS — E785 Hyperlipidemia, unspecified: Secondary | ICD-10-CM

## 2021-06-23 DIAGNOSIS — R7303 Prediabetes: Secondary | ICD-10-CM

## 2021-06-23 LAB — HEMOGLOBIN A1C: Hgb A1c MFr Bld: 5.8 % (ref 4.6–6.5)

## 2021-06-24 LAB — COMPREHENSIVE METABOLIC PANEL
ALT: 14 U/L (ref 0–35)
AST: 20 U/L (ref 0–37)
Albumin: 4.7 g/dL (ref 3.5–5.2)
Alkaline Phosphatase: 68 U/L (ref 39–117)
BUN: 16 mg/dL (ref 6–23)
CO2: 29 mEq/L (ref 19–32)
Calcium: 9.8 mg/dL (ref 8.4–10.5)
Chloride: 105 mEq/L (ref 96–112)
Creatinine, Ser: 1.02 mg/dL (ref 0.40–1.20)
GFR: 57.81 mL/min — ABNORMAL LOW (ref >60.00)
Glucose, Bld: 78 mg/dL (ref 70–99)
Potassium: 4.5 mEq/L (ref 3.5–5.1)
Sodium: 140 mEq/L (ref 135–145)
Total Bilirubin: 0.7 mg/dL (ref 0.2–1.2)
Total Protein: 7.1 g/dL (ref 6.0–8.3)

## 2021-06-24 LAB — LIPID PANEL
Cholesterol: 161 mg/dL (ref 0–200)
HDL: 56.7 mg/dL (ref >39.00)
LDL Cholesterol: 84 mg/dL (ref 0–99)
NonHDL: 103.86
Total CHOL/HDL Ratio: 3
Triglycerides: 97 mg/dL (ref 0.0–149.0)
VLDL: 19.4 mg/dL (ref 0.0–40.0)

## 2021-06-27 ENCOUNTER — Other Ambulatory Visit: Payer: No Typology Code available for payment source

## 2021-07-10 ENCOUNTER — Other Ambulatory Visit: Payer: Self-pay | Admitting: Physician Assistant

## 2021-07-10 DIAGNOSIS — E785 Hyperlipidemia, unspecified: Secondary | ICD-10-CM

## 2021-07-17 ENCOUNTER — Telehealth: Payer: Self-pay

## 2021-07-17 DIAGNOSIS — H5203 Hypermetropia, bilateral: Secondary | ICD-10-CM | POA: Diagnosis not present

## 2021-07-17 DIAGNOSIS — E785 Hyperlipidemia, unspecified: Secondary | ICD-10-CM

## 2021-07-17 DIAGNOSIS — H524 Presbyopia: Secondary | ICD-10-CM | POA: Diagnosis not present

## 2021-07-17 DIAGNOSIS — H52209 Unspecified astigmatism, unspecified eye: Secondary | ICD-10-CM | POA: Diagnosis not present

## 2021-07-17 NOTE — Telephone Encounter (Signed)
Pt is overdue for follow up with fasting labs, needs to schedule then we can send in curtesy refill  ?

## 2021-07-17 NOTE — Telephone Encounter (Signed)
Encourage patient to contact the pharmacy for refills or they can request refills through [facility] ? ?(Please schedule appointment if patient has not been seen in over a year) ? ? ? ?WHAT PHARMACY WOULD THEY LIKE THIS SENT TO: [facility] [address]  ? ?MEDICATION NAME & DOSE:rosuvastatin (CRESTOR) 10 MG tablet  ? ?NOTES/COMMENTS FROM PATIENT:Please note pharmacy change  ? ? ? ? ? ?[place] office please notify patient: ?It takes 48-72 hours to process rx refill requests ?Ask patient to call pharmacy to ensure rx is ready before heading there.  ? ?

## 2021-07-18 ENCOUNTER — Other Ambulatory Visit: Payer: Self-pay

## 2021-07-18 DIAGNOSIS — E785 Hyperlipidemia, unspecified: Secondary | ICD-10-CM

## 2021-07-18 MED ORDER — ROSUVASTATIN CALCIUM 10 MG PO TABS: 10.0000 mg | ORAL_TABLET | Freq: Every day | ORAL | 1 refills | Status: DC

## 2021-07-18 NOTE — Telephone Encounter (Signed)
sent 

## 2021-08-10 ENCOUNTER — Encounter: Payer: Self-pay | Admitting: Family Medicine

## 2021-09-02 DIAGNOSIS — I872 Venous insufficiency (chronic) (peripheral): Secondary | ICD-10-CM | POA: Diagnosis not present

## 2021-09-02 DIAGNOSIS — R252 Cramp and spasm: Secondary | ICD-10-CM | POA: Diagnosis not present

## 2021-09-02 DIAGNOSIS — I83811 Varicose veins of right lower extremities with pain: Secondary | ICD-10-CM | POA: Diagnosis not present

## 2021-09-02 DIAGNOSIS — M7989 Other specified soft tissue disorders: Secondary | ICD-10-CM | POA: Diagnosis not present

## 2021-12-19 ENCOUNTER — Ambulatory Visit
Admission: RE | Admit: 2021-12-19 | Discharge: 2021-12-19 | Disposition: A | Discharge status: Home / Self Care | Payer: Medicare HMO | Source: Ambulatory Visit | Attending: Family Medicine | Admitting: Family Medicine

## 2021-12-19 ENCOUNTER — Ambulatory Visit (INDEPENDENT_AMBULATORY_CARE_PROVIDER_SITE_OTHER): Payer: Medicare HMO | Admitting: Family Medicine

## 2021-12-19 ENCOUNTER — Encounter: Payer: Self-pay | Admitting: Family Medicine

## 2021-12-19 VITALS — BP 118/66 | HR 65 | Temp 98.0°F | Resp 18 | Ht 65.0 in | Wt 133.0 lb

## 2021-12-19 DIAGNOSIS — R7303 Prediabetes: Secondary | ICD-10-CM

## 2021-12-19 DIAGNOSIS — E049 Nontoxic goiter, unspecified: Secondary | ICD-10-CM

## 2021-12-19 DIAGNOSIS — R252 Cramp and spasm: Secondary | ICD-10-CM

## 2021-12-19 DIAGNOSIS — E041 Nontoxic single thyroid nodule: Secondary | ICD-10-CM | POA: Diagnosis not present

## 2021-12-19 DIAGNOSIS — E785 Hyperlipidemia, unspecified: Secondary | ICD-10-CM | POA: Diagnosis not present

## 2021-12-19 LAB — HEMOGLOBIN A1C: Hgb A1c MFr Bld: 6 % (ref 4.6–6.5)

## 2021-12-19 LAB — LIPID PANEL
Cholesterol: 162 mg/dL (ref 0–200)
HDL: 49.3 mg/dL (ref >39.00)
LDL Cholesterol: 89 mg/dL (ref 0–99)
NonHDL: 112.62
Total CHOL/HDL Ratio: 3
Triglycerides: 117 mg/dL (ref 0.0–149.0)
VLDL: 23.4 mg/dL (ref 0.0–40.0)

## 2021-12-19 LAB — COMPREHENSIVE METABOLIC PANEL
ALT: 15 U/L (ref 0–35)
AST: 21 U/L (ref 0–37)
Albumin: 4.7 g/dL (ref 3.5–5.2)
Alkaline Phosphatase: 68 U/L (ref 39–117)
BUN: 16 mg/dL (ref 6–23)
CO2: 28 mEq/L (ref 19–32)
Calcium: 9.8 mg/dL (ref 8.4–10.5)
Chloride: 106 mEq/L (ref 96–112)
Creatinine, Ser: 0.94 mg/dL (ref 0.40–1.20)
GFR: 63.54 mL/min (ref >60.00)
Glucose, Bld: 87 mg/dL (ref 70–99)
Potassium: 4.9 mEq/L (ref 3.5–5.1)
Sodium: 139 mEq/L (ref 135–145)
Total Bilirubin: 0.6 mg/dL (ref 0.2–1.2)
Total Protein: 7.4 g/dL (ref 6.0–8.3)

## 2021-12-19 LAB — TSH: TSH: 3.54 u[IU]/mL (ref 0.35–5.50)

## 2021-12-19 MED ORDER — ROSUVASTATIN CALCIUM 10 MG PO TABS: 10.0000 mg | ORAL_TABLET | Freq: Every day | ORAL | 1 refills | Status: DC

## 2021-12-19 NOTE — Progress Notes (Signed)
Subjective:  Patient ID: Monica Wyatt, female    DOB: [birth year]  Age: 66 y.o. MRN: [id number]  CC:  Chief Complaint  Patient presents with   Hyperlipidemia    Pt states all is going ok, pt is fasting    HPI Monica Wyatt presents for   Med review. No change in health since last visit.   Prediabetes: Avoiding sugar containing beverages.  Exercise with riding bicycle, exercise bike at times. Staying active. No regular fast food, occasional   Lab Results  Component Value Date   HGBA1C 5.8 06/23/2021   Wt Readings from Last 3 Encounters:  12/19/21 133 lb (60.3 kg)  05/22/21 135 lb (61.2 kg)  12/12/20 129 lb (58.5 kg)   Hyperlipidemia: Crestor 10 mg daily, rare ache in leg at night, few days per week. Notes at night.  Fasting today.  Lab Results  Component Value Date   CHOL 161 06/23/2021   HDL 56.70 06/23/2021   LDLCALC 84 06/23/2021   TRIG 97.0 06/23/2021   CHOLHDL 3 06/23/2021   Lab Results  Component Value Date   ALT 14 06/23/2021   AST 20 06/23/2021   GGT 11 02/17/2016   ALKPHOS 68 06/23/2021   BILITOT 0.7 06/23/2021      She has been under the care of Dr. Renaldo Reel for varicose veins, venous insufficiency.  History of osteoporosis followed by gynecology status post 5 years Fosamax then discontinued.  History Patient Active Problem List   Diagnosis Date Noted   Healthcare maintenance 10/14/2017   Insomnia 10/14/2017   Pre-diabetes 10/14/2017   Screening for colon cancer 10/14/2017   Osteoporosis 03/18/2017   Hyperlipidemia 06/08/2016   Chronic tension-type headache, not intractable 06/08/2016   Past Medical History:  Diagnosis Date   GERD (gastroesophageal reflux disease)    Headache(784.0)    Hyperlipidemia    Medical history non-contributory    Osteoporosis    Past Surgical History:  Procedure Laterality Date   HERNIA REPAIR     HYSTEROSCOPY WITH D & C N/A 07/22/2012   Procedure: DILATATION AND CURETTAGE /HYSTEROSCOPY;  Surgeon:  [name]. [name], MD;  Location: [facility];  Service: Gynecology;  Laterality: N/A;   LAPAROSCOPY N/A 07/22/2012   Procedure: LAPAROSCOPY OPERATIVE;  Surgeon: [name] [name], MD;  Location: [facility];  Service: Gynecology;  Laterality: N/A;   NO PAST SURGERIES     SALPINGOOPHORECTOMY Bilateral 07/22/2012   Procedure: SALPINGO OOPHORECTOMY;  Surgeon: [name] [name], MD;  Location: [facility];  Service: Gynecology;  Laterality: Bilateral;   Allergies  Allergen Reactions   Penicillins Hives    Childhood reaction   Prior to Admission medications   Medication Sig Start Date End Date Taking? Authorizing Provider  Cholecalciferol (VITAMIN D3) 2000 units TABS Take 1 tablet by mouth daily.    [provider]  rosuvastatin (CRESTOR) 10 MG tablet Take 1 tablet (10 mg total) by mouth daily. 07/18/21   Wendie Agreste, MD   Social History   Socioeconomic History   Marital status: Married    Spouse name: Not on file   Number of children: Not on file   Years of education: Not on file   Highest education level: Not on file  Occupational History   Not on file  Tobacco Use   Smoking status: Never   Smokeless tobacco: Never  Vaping Use   Vaping Use: Never used  Substance and Sexual Activity   Alcohol use: Not Currently   Drug use: No  Sexual activity: Not Currently    Birth control/protection: Surgical  Other Topics Concern   Not on file  Social History Narrative   Not on file   Social Determinants of Health   Financial Resource Strain: Not on file  Food Insecurity: Not on file  Transportation Needs: Not on file  Physical Activity: Not on file  Stress: Not on file  Social Connections: Not on file  Intimate Partner Violence: Not on file    Review of Systems  Constitutional:  Negative for fatigue and unexpected weight change.  Respiratory:  Negative for chest tightness and shortness of breath.   Cardiovascular:  Negative for chest pain, palpitations and leg swelling.   Gastrointestinal:  Negative for abdominal pain and blood in stool.  Neurological:  Negative for dizziness, syncope, light-headedness and headaches.     Objective:   Vitals:   12/19/21 0808  BP: 118/66  Pulse: 65  Resp: 18  Temp: 98 F (36.7 C)  SpO2: 94%  Weight: 133 lb (60.3 kg)  Height: '5\' 5"'$  (1.651 m)     Physical Exam Vitals reviewed.  Constitutional:      Appearance: Normal appearance. She is well-developed.  HENT:     Head: Normocephalic and atraumatic.  Eyes:     Conjunctiva/sclera: Conjunctivae normal.     Pupils: Pupils are equal, round, and reactive to light.  Neck:     Vascular: No carotid bruit.     Comments: Prominent, slight enlarged R lobe of thyroid, no nodules appreciated.  Cardiovascular:     Rate and Rhythm: Normal rate and regular rhythm.     Heart sounds: Normal heart sounds.  Pulmonary:     Effort: Pulmonary effort is normal.     Breath sounds: Normal breath sounds.  Abdominal:     Palpations: Abdomen is soft. There is no pulsatile mass.     Tenderness: There is no abdominal tenderness.  Musculoskeletal:     Right lower leg: No edema.     Left lower leg: No edema.  Skin:    General: Skin is warm and dry.  Neurological:     Mental Status: She is alert and oriented to person, place, and time.  Psychiatric:        Mood and Affect: Mood normal.        Behavior: Behavior normal.     Assessment & Plan:  Monica Wyatt is a 66 y.o. female . Hyperlipidemia, unspecified hyperlipidemia type - Plan: Lipid panel, Comprehensive metabolic panel, rosuvastatin (CRESTOR) 10 MG tablet  -  overall stable, tolerating current regimen. Medications refilled. Labs pending as above.  Episodic cramping leg may or may not be related to statin.  See information below on plan.  Pre-diabetes - Plan: Hemoglobin A1c  -Continue to watch diet, exercise, updated A1c ordered  Thyroid enlargement - Plan: US THYROID, TSH  -New concern, asymptomatic but prominent right  lobe of thyroid, no apparent nodule.  Check TSH, thyroid ultrasound.  Leg cramp - Plan: Comprehensive metabolic panel  -Infrequent, right-sided.  Recommended initially hamstring and calf stretches, especially before bedtime.  Demonstrated in office.  Option of temporary stent holiday for 1 to 2 weeks if that does not improve and if cramps resolve off statin, can discuss alternative statin or dosing changes.  RTC precautions  Meds ordered this encounter  Medications   rosuvastatin (CRESTOR) 10 MG tablet    Sig: Take 1 tablet (10 mg total) by mouth daily.    Dispense:  90 tablet  Refill:  1   Patient Instructions  No medication changes today.  Try stretches for the hamstring/thigh muscles and calf muscles as we discussed to see if that helps with cramping at night.  If that does not help, you can stop Crestor for 1 to 2 weeks and if the cramping resolves let me know so we can adjust your medication.  I will order an ultrasound and thyroid test as there may be a prominent or slight enlargement of the right-sided thyroid, but that is subtle.  I will let you know if there are any concerns on those exams.  Follow-up in 6 months for physical, please let me know if there are questions sooner.    Signed,   Merri Ray, MD [place], [facility] 12/19/21 8:48 AM

## 2021-12-19 NOTE — Patient Instructions (Signed)
No medication changes today.  Try stretches for the hamstring/thigh muscles and calf muscles as we discussed to see if that helps with cramping at night.  If that does not help, you can stop Crestor for 1 to 2 weeks and if the cramping resolves let me know so we can adjust your medication.  I will order an ultrasound and thyroid test as there may be a prominent or slight enlargement of the right-sided thyroid, but that is subtle.  I will let you know if there are any concerns on those exams.  Follow-up in 6 months for physical, please let me know if there are questions sooner.

## 2021-12-30 DIAGNOSIS — N951 Menopausal and female climacteric states: Secondary | ICD-10-CM | POA: Diagnosis not present

## 2021-12-30 DIAGNOSIS — Z6822 Body mass index (BMI) 22.0-22.9, adult: Secondary | ICD-10-CM | POA: Diagnosis not present

## 2021-12-30 DIAGNOSIS — Z0142 Encounter for cervical smear to confirm findings of recent normal smear following initial abnormal smear: Secondary | ICD-10-CM | POA: Diagnosis not present

## 2021-12-30 DIAGNOSIS — Z01411 Encounter for gynecological examination (general) (routine) with abnormal findings: Secondary | ICD-10-CM | POA: Diagnosis not present

## 2021-12-30 DIAGNOSIS — Z01419 Encounter for gynecological examination (general) (routine) without abnormal findings: Secondary | ICD-10-CM | POA: Diagnosis not present

## 2021-12-30 DIAGNOSIS — L282 Other prurigo: Secondary | ICD-10-CM | POA: Diagnosis not present

## 2021-12-30 DIAGNOSIS — Z124 Encounter for screening for malignant neoplasm of cervix: Secondary | ICD-10-CM | POA: Diagnosis not present

## 2022-02-02 DIAGNOSIS — Z1231 Encounter for screening mammogram for malignant neoplasm of breast: Secondary | ICD-10-CM | POA: Diagnosis not present

## 2022-02-27 ENCOUNTER — Telehealth: Payer: Self-pay | Admitting: Family Medicine

## 2022-02-27 NOTE — Telephone Encounter (Signed)
Left message for patient to call back and schedule Medicare Annual Wellness Visit (AWV).   Please offer to do virtually or by telephone.  Left office number and my jabber #[phone].  AWVI eligible as of 02/08/2022  Please schedule at anytime with Nurse Health Advisor.   

## 2022-06-15 ENCOUNTER — Other Ambulatory Visit: Payer: Self-pay | Admitting: Family Medicine

## 2022-06-15 DIAGNOSIS — E785 Hyperlipidemia, unspecified: Secondary | ICD-10-CM

## 2022-06-22 ENCOUNTER — Ambulatory Visit: Payer: Medicare HMO | Admitting: Family Medicine

## 2022-06-26 ENCOUNTER — Encounter: Payer: Self-pay | Admitting: Family Medicine

## 2022-06-26 ENCOUNTER — Ambulatory Visit (INDEPENDENT_AMBULATORY_CARE_PROVIDER_SITE_OTHER): Payer: Medicare HMO | Admitting: Family Medicine

## 2022-06-26 VITALS — BP 120/68 | HR 68 | Ht 65.0 in | Wt 135.1 lb

## 2022-06-26 DIAGNOSIS — Z23 Encounter for immunization: Secondary | ICD-10-CM

## 2022-06-26 DIAGNOSIS — E785 Hyperlipidemia, unspecified: Secondary | ICD-10-CM | POA: Diagnosis not present

## 2022-06-26 DIAGNOSIS — R12 Heartburn: Secondary | ICD-10-CM | POA: Diagnosis not present

## 2022-06-26 DIAGNOSIS — R7303 Prediabetes: Secondary | ICD-10-CM

## 2022-06-26 LAB — LIPID PANEL
Cholesterol: 174 mg/dL (ref 0–200)
HDL: 56.8 mg/dL (ref >39.00)
LDL Cholesterol: 93 mg/dL (ref 0–99)
NonHDL: 117.32
Total CHOL/HDL Ratio: 3
Triglycerides: 121 mg/dL (ref 0.0–149.0)
VLDL: 24.2 mg/dL (ref 0.0–40.0)

## 2022-06-26 LAB — COMPREHENSIVE METABOLIC PANEL
ALT: 17 U/L (ref 0–35)
AST: 23 U/L (ref 0–37)
Albumin: 4.7 g/dL (ref 3.5–5.2)
Alkaline Phosphatase: 68 U/L (ref 39–117)
BUN: 19 mg/dL (ref 6–23)
CO2: 30 mEq/L (ref 19–32)
Calcium: 10.1 mg/dL (ref 8.4–10.5)
Chloride: 103 mEq/L (ref 96–112)
Creatinine, Ser: 0.91 mg/dL (ref 0.40–1.20)
GFR: 65.83 mL/min (ref >60.00)
Glucose, Bld: 86 mg/dL (ref 70–99)
Potassium: 5.2 mEq/L — ABNORMAL HIGH (ref 3.5–5.1)
Sodium: 139 mEq/L (ref 135–145)
Total Bilirubin: 0.6 mg/dL (ref 0.2–1.2)
Total Protein: 7.6 g/dL (ref 6.0–8.3)

## 2022-06-26 LAB — HEMOGLOBIN A1C: Hgb A1c MFr Bld: 5.9 % (ref 4.6–6.5)

## 2022-06-26 MED ORDER — ROSUVASTATIN CALCIUM 10 MG PO TABS: 10.0000 mg | ORAL_TABLET | Freq: Every day | ORAL | 1 refills | Status: DC

## 2022-06-26 MED ORDER — FAMOTIDINE 10 MG PO TABS: 10.0000 mg | ORAL_TABLET | Freq: Two times a day (BID) | ORAL | 3 refills | Status: DC

## 2022-06-26 NOTE — Patient Instructions (Addendum)
Pepcid twice per day, and see info below. If heartburn is not improving, then start prilosec OTC once per day - 30 minutes prior to meal in the morning, and continue pepcid at night. Recheck in 6 weeks.  No other med changes today. I will let you know if lab concerns.    Food Choices for Gastroesophageal Reflux Disease, Adult When you have gastroesophageal reflux disease (GERD), the foods you eat and your eating habits are very important. Choosing the right foods can help ease the discomfort of GERD. Consider working with a dietitian to help you make healthy food choices. What are tips for following this plan? Reading food labels Look for foods that are low in saturated fat. Foods that have less than 5% of daily value (DV) of fat and 0 g of trans fats may help with your symptoms. Cooking Cook foods using methods other than frying. This may include baking, steaming, grilling, or broiling. These are all methods that do not need a lot of fat for cooking. To add flavor, try to use herbs that are low in spice and acidity. Meal planning  Choose healthy foods that are low in fat, such as fruits, vegetables, whole grains, low-fat dairy products, lean meats, fish, and poultry. Eat frequent, small meals instead of three large meals each day. Eat your meals slowly, in a relaxed setting. Avoid bending over or lying down until 2-3 hours after eating. Limit high-fat foods such as fatty meats or fried foods. Limit your intake of fatty foods, such as oils, butter, and shortening. Avoid the following as told by your health care provider: Foods that cause symptoms. These may be different for different people. Keep a food diary to keep track of foods that cause symptoms. Alcohol. Drinking large amounts of liquid with meals. Eating meals during the 2-3 hours before bed. Lifestyle Maintain a healthy weight. Ask your health care provider what weight is healthy for you. If you need to lose weight, work with your  health care provider to do so safely. Exercise for at least 30 minutes on 5 or more days each week, or as told by your health care provider. Avoid wearing clothes that fit tightly around your waist and chest. Do not use any products that contain nicotine or tobacco. These products include cigarettes, chewing tobacco, and vaping devices, such as e-cigarettes. If you need help quitting, ask your health care provider. Sleep with the head of your bed raised. Use a wedge under the mattress or blocks under the bed frame to raise the head of the bed. Chew sugar-free gum after mealtimes. What foods should I eat?  Eat a healthy, well-balanced diet of fruits, vegetables, whole grains, low-fat dairy products, lean meats, fish, and poultry. Each person is different. Foods that may trigger symptoms in one person may not trigger any symptoms in another person. Work with your health care provider to identify foods that are safe for you. The items listed above may not be a complete list of recommended foods and beverages. Contact a dietitian for more information. What foods should I avoid? Limiting some of these foods may help manage the symptoms of GERD. Everyone is different. Consult a dietitian or your health care provider to help you identify the exact foods to avoid, if any. Fruits Any fruits prepared with added fat. Any fruits that cause symptoms. For some people this may include citrus fruits, such as oranges, grapefruit, pineapple, and lemons. Vegetables Deep-fried vegetables. [place] fries. Any vegetables prepared with added fat.  Any vegetables that cause symptoms. For some people, this may include tomatoes and tomato products, chili peppers, onions and garlic, and horseradish. Grains Pastries or quick breads with added fat. Meats and other proteins High-fat meats, such as fatty beef or pork, hot dogs, ribs, ham, sausage, salami, and bacon. Fried meat or protein, including fried fish and fried chicken.  Nuts and nut butters, in large amounts. Dairy Whole milk and chocolate milk. Sour cream. Cream. Ice cream. Cream cheese. Milkshakes. Fats and oils Butter. Margarine. Shortening. Ghee. Beverages Coffee and tea, with or without caffeine. Carbonated beverages. Sodas. Energy drinks. Fruit juice made with acidic fruits, such as orange or grapefruit. Tomato juice. Alcoholic drinks. Sweets and desserts Chocolate and cocoa. Donuts. Seasonings and condiments Pepper. Peppermint and spearmint. Added salt. Any condiments, herbs, or seasonings that cause symptoms. For some people, this may include curry, hot sauce, or vinegar-based salad dressings. The items listed above may not be a complete list of foods and beverages to avoid. Contact a dietitian for more information. Questions to ask your health care provider Diet and lifestyle changes are usually the first steps that are taken to manage symptoms of GERD. If diet and lifestyle changes do not improve your symptoms, talk with your health care provider about taking medicines. Where to find more information International Foundation for Gastrointestinal Disorders: aboutgerd.org Summary When you have gastroesophageal reflux disease (GERD), food and lifestyle choices may be very helpful in easing the discomfort of GERD. Eat frequent, small meals instead of three large meals each day. Eat your meals slowly, in a relaxed setting. Avoid bending over or lying down until 2-3 hours after eating. Limit high-fat foods such as fatty meats or fried foods. This information is not intended to replace advice given to you by your health care provider. Make sure you discuss any questions you have with your health care provider. Document Revised: 11/06/2019 Document Reviewed: 11/06/2019 Elsevier Patient Education  [place].

## 2022-06-26 NOTE — Progress Notes (Signed)
Subjective:  Patient ID: Monica Wyatt, female    DOB: [birth year]  Age: 67 y.o. MRN: [id number]  CC:  Chief Complaint  Patient presents with   Hyperlipidemia   HPI Monica Wyatt presents for   Hyperlipidemia: Crestor 10 mg daily. Still once per day. No new myalgias. Fasting today.  Lab Results  Component Value Date   CHOL 162 12/19/2021   HDL 49.30 12/19/2021   LDLCALC 89 12/19/2021   TRIG 117.0 12/19/2021   CHOLHDL 3 12/19/2021   Lab Results  Component Value Date   ALT 15 12/19/2021   AST 21 12/19/2021   GGT 11 02/17/2016   ALKPHOS 68 12/19/2021   BILITOT 0.6 12/19/2021   Prediabetes: Weight stable, up few pounds from August.  Exercise with riding bicycle, avoiding sugar-containing beverages, staying active.  Avoiding fast food usually. Some decreased exercise during cold weather.   Lab Results  Component Value Date   HGBA1C 6.0 12/19/2021   Wt Readings from Last 3 Encounters:  06/26/22 135 lb 2 oz (61.3 kg)  12/19/21 133 lb (60.3 kg)  05/22/21 135 lb (61.2 kg)   Heartburn Past few months. More heartburn recently. No melena/hematochezia.  No abd pain/n/v.  Slight cough with heartburn.  Tx: otc antacid. Tums. Mostly after meals.  No unexplained wt loss, night sweats or fevers.   History Patient Active Problem List   Diagnosis Date Noted   Healthcare maintenance 10/14/2017   Insomnia 10/14/2017   Pre-diabetes 10/14/2017   Screening for colon cancer 10/14/2017   Osteoporosis 03/18/2017   Hyperlipidemia 06/08/2016   Chronic tension-type headache, not intractable 06/08/2016    Past Medical History:  Diagnosis Date   GERD (gastroesophageal reflux disease)    Headache(784.0)    Hyperlipidemia    Medical history non-contributory    Osteoporosis       Review of Systems Per HPI.   Objective:   Vitals:   06/26/22 0820  BP: 120/68  Pulse: 68  SpO2: 96%  Weight: 135 lb 2 oz (61.3 kg)  Height: 5' 5"$  (1.651 m)     Physical Exam Vitals  reviewed.  Constitutional:      Appearance: Normal appearance. She is well-developed.  HENT:     Head: Normocephalic and atraumatic.  Eyes:     Conjunctiva/sclera: Conjunctivae normal.     Pupils: Pupils are equal, round, and reactive to light.  Neck:     Vascular: No carotid bruit.  Cardiovascular:     Rate and Rhythm: Normal rate and regular rhythm.     Heart sounds: Normal heart sounds.  Pulmonary:     Effort: Pulmonary effort is normal.     Breath sounds: Normal breath sounds.  Abdominal:     General: Abdomen is flat. There is no distension.     Palpations: Abdomen is soft. There is no pulsatile mass.     Tenderness: There is no abdominal tenderness. There is no guarding.  Musculoskeletal:     Right lower leg: No edema.     Left lower leg: No edema.  Skin:    General: Skin is warm and dry.  Neurological:     Mental Status: She is alert and oriented to person, place, and time.  Psychiatric:        Mood and Affect: Mood normal.        Behavior: Behavior normal.     Assessment & Plan:  Monica Wyatt is a 67 y.o. female . Hyperlipidemia, unspecified hyperlipidemia type Assessment & Plan:  Tolerating Crestor at current dose, check labs, medication adjustment accordingly.  Orders: -     Comprehensive metabolic panel -     Lipid panel -     Rosuvastatin Calcium; Take 1 tablet (10 mg total) by mouth daily.  Dispense: 90 tablet; Refill: 1  Flu vaccine need -     Flu vaccine HIGH DOSE PF  Heartburn -     Famotidine; Take 1 tablet (10 mg total) by mouth 2 (two) times daily.  Dispense: 60 tablet; Refill: 3 Handout given on trigger avoidance, start Pepcid twice daily, option to change to omeprazole in the morning, Pepcid at night if continued breakthrough symptoms, recheck 6 weeks.  No red flags on exam or history at this time. Prediabetes -     Hemoglobin A1c  Pre-diabetes Assessment & Plan: Check A1c, continue to watch diet, exercise which should be increasing as  weather improves.     Patient Instructions  Pepcid twice per day, and see info below. If heartburn is not improving, then start prilosec OTC once per day - 30 minutes prior to meal in the morning, and continue pepcid at night. Recheck in 6 weeks.  No other med changes today. I will let you know if lab concerns.    Food Choices for Gastroesophageal Reflux Disease, Adult When you have gastroesophageal reflux disease (GERD), the foods you eat and your eating habits are very important. Choosing the right foods can help ease the discomfort of GERD. Consider working with a dietitian to help you make healthy food choices. What are tips for following this plan? Reading food labels Look for foods that are low in saturated fat. Foods that have less than 5% of daily value (DV) of fat and 0 g of trans fats may help with your symptoms. Cooking Cook foods using methods other than frying. This may include baking, steaming, grilling, or broiling. These are all methods that do not need a lot of fat for cooking. To add flavor, try to use herbs that are low in spice and acidity. Meal planning  Choose healthy foods that are low in fat, such as fruits, vegetables, whole grains, low-fat dairy products, lean meats, fish, and poultry. Eat frequent, small meals instead of three large meals each day. Eat your meals slowly, in a relaxed setting. Avoid bending over or lying down until 2-3 hours after eating. Limit high-fat foods such as fatty meats or fried foods. Limit your intake of fatty foods, such as oils, butter, and shortening. Avoid the following as told by your health care provider: Foods that cause symptoms. These may be different for different people. Keep a food diary to keep track of foods that cause symptoms. Alcohol. Drinking large amounts of liquid with meals. Eating meals during the 2-3 hours before bed. Lifestyle Maintain a healthy weight. Ask your health care provider what weight is healthy for  you. If you need to lose weight, work with your health care provider to do so safely. Exercise for at least 30 minutes on 5 or more days each week, or as told by your health care provider. Avoid wearing clothes that fit tightly around your waist and chest. Do not use any products that contain nicotine or tobacco. These products include cigarettes, chewing tobacco, and vaping devices, such as e-cigarettes. If you need help quitting, ask your health care provider. Sleep with the head of your bed raised. Use a wedge under the mattress or blocks under the bed frame to raise the head of the  bed. Chew sugar-free gum after mealtimes. What foods should I eat?  Eat a healthy, well-balanced diet of fruits, vegetables, whole grains, low-fat dairy products, lean meats, fish, and poultry. Each person is different. Foods that may trigger symptoms in one person may not trigger any symptoms in another person. Work with your health care provider to identify foods that are safe for you. The items listed above may not be a complete list of recommended foods and beverages. Contact a dietitian for more information. What foods should I avoid? Limiting some of these foods may help manage the symptoms of GERD. Everyone is different. Consult a dietitian or your health care provider to help you identify the exact foods to avoid, if any. Fruits Any fruits prepared with added fat. Any fruits that cause symptoms. For some people this may include citrus fruits, such as oranges, grapefruit, pineapple, and lemons. Vegetables Deep-fried vegetables. [place] fries. Any vegetables prepared with added fat. Any vegetables that cause symptoms. For some people, this may include tomatoes and tomato products, chili peppers, onions and garlic, and horseradish. Grains Pastries or quick breads with added fat. Meats and other proteins High-fat meats, such as fatty beef or pork, hot dogs, ribs, ham, sausage, salami, and bacon. Fried meat or  protein, including fried fish and fried chicken. Nuts and nut butters, in large amounts. Dairy Whole milk and chocolate milk. Sour cream. Cream. Ice cream. Cream cheese. Milkshakes. Fats and oils Butter. Margarine. Shortening. Ghee. Beverages Coffee and tea, with or without caffeine. Carbonated beverages. Sodas. Energy drinks. Fruit juice made with acidic fruits, such as orange or grapefruit. Tomato juice. Alcoholic drinks. Sweets and desserts Chocolate and cocoa. Donuts. Seasonings and condiments Pepper. Peppermint and spearmint. Added salt. Any condiments, herbs, or seasonings that cause symptoms. For some people, this may include curry, hot sauce, or vinegar-based salad dressings. The items listed above may not be a complete list of foods and beverages to avoid. Contact a dietitian for more information. Questions to ask your health care provider Diet and lifestyle changes are usually the first steps that are taken to manage symptoms of GERD. If diet and lifestyle changes do not improve your symptoms, talk with your health care provider about taking medicines. Where to find more information International Foundation for Gastrointestinal Disorders: aboutgerd.org Summary When you have gastroesophageal reflux disease (GERD), food and lifestyle choices may be very helpful in easing the discomfort of GERD. Eat frequent, small meals instead of three large meals each day. Eat your meals slowly, in a relaxed setting. Avoid bending over or lying down until 2-3 hours after eating. Limit high-fat foods such as fatty meats or fried foods. This information is not intended to replace advice given to you by your health care provider. Make sure you discuss any questions you have with your health care provider. Document Revised: 11/06/2019 Document Reviewed: 11/06/2019 Elsevier Patient Education  [place],   Merri Ray, MD [place] 06/26/22 8:59 AM

## 2022-06-26 NOTE — Assessment & Plan Note (Signed)
Check A1c, continue to watch diet, exercise which should be increasing as weather improves.

## 2022-06-26 NOTE — Assessment & Plan Note (Signed)
Tolerating Crestor at current dose, check labs, medication adjustment accordingly.

## 2022-08-10 ENCOUNTER — Ambulatory Visit: Payer: Medicare HMO | Admitting: Family Medicine

## 2022-10-25 NOTE — Progress Notes (Signed)
Per epic patient reviewed my chart message

## 2022-12-09 ENCOUNTER — Encounter (INDEPENDENT_AMBULATORY_CARE_PROVIDER_SITE_OTHER): Payer: Self-pay

## 2022-12-24 ENCOUNTER — Ambulatory Visit (INDEPENDENT_AMBULATORY_CARE_PROVIDER_SITE_OTHER): Payer: Medicare HMO | Admitting: Family Medicine

## 2022-12-24 ENCOUNTER — Encounter (INDEPENDENT_AMBULATORY_CARE_PROVIDER_SITE_OTHER): Payer: Self-pay

## 2022-12-24 ENCOUNTER — Encounter: Payer: Self-pay | Admitting: Family Medicine

## 2022-12-24 VITALS — BP 116/68 | HR 64 | Temp 97.9°F | Ht 64.0 in | Wt 136.6 lb

## 2022-12-24 DIAGNOSIS — R7303 Prediabetes: Secondary | ICD-10-CM | POA: Diagnosis not present

## 2022-12-24 DIAGNOSIS — Z0000 Encounter for general adult medical examination without abnormal findings: Secondary | ICD-10-CM | POA: Diagnosis not present

## 2022-12-24 DIAGNOSIS — R052 Subacute cough: Secondary | ICD-10-CM | POA: Diagnosis not present

## 2022-12-24 DIAGNOSIS — E785 Hyperlipidemia, unspecified: Secondary | ICD-10-CM | POA: Diagnosis not present

## 2022-12-24 DIAGNOSIS — R12 Heartburn: Secondary | ICD-10-CM | POA: Diagnosis not present

## 2022-12-24 DIAGNOSIS — Z130 Encounter for screening for diseases of the blood and blood-forming organs and certain disorders involving the immune mechanism: Secondary | ICD-10-CM

## 2022-12-24 DIAGNOSIS — Z1159 Encounter for screening for other viral diseases: Secondary | ICD-10-CM

## 2022-12-24 LAB — CBC WITH DIFFERENTIAL/PLATELET
Basophils Absolute: 0 10*3/uL (ref 0.0–0.1)
Basophils Relative: 0.9 % (ref 0.0–3.0)
Eosinophils Absolute: 0.1 10*3/uL (ref 0.0–0.7)
Eosinophils Relative: 2 % (ref 0.0–5.0)
HCT: 42.9 % (ref 36.0–46.0)
Hemoglobin: 14 g/dL (ref 12.0–15.0)
Lymphocytes Relative: 32.3 % (ref 12.0–46.0)
Lymphs Abs: 1.6 10*3/uL (ref 0.7–4.0)
MCHC: 32.7 g/dL (ref 30.0–36.0)
MCV: 95.8 fl (ref 78.0–100.0)
Monocytes Absolute: 0.7 10*3/uL (ref 0.1–1.0)
Monocytes Relative: 13.9 % — ABNORMAL HIGH (ref 3.0–12.0)
Neutro Abs: 2.5 10*3/uL (ref 1.4–7.7)
Neutrophils Relative %: 50.9 % (ref 43.0–77.0)
Platelets: 232 10*3/uL (ref 150.0–400.0)
RBC: 4.48 Mil/uL (ref 3.87–5.11)
RDW: 13.3 % (ref 11.5–15.5)
WBC: 4.8 10*3/uL (ref 4.0–10.5)

## 2022-12-24 LAB — COMPREHENSIVE METABOLIC PANEL
ALT: 16 U/L (ref 0–35)
AST: 22 U/L (ref 0–37)
Albumin: 4.6 g/dL (ref 3.5–5.2)
Alkaline Phosphatase: 69 U/L (ref 39–117)
BUN: 20 mg/dL (ref 6–23)
CO2: 30 mEq/L (ref 19–32)
Calcium: 10.2 mg/dL (ref 8.4–10.5)
Chloride: 102 mEq/L (ref 96–112)
Creatinine, Ser: 0.98 mg/dL (ref 0.40–1.20)
GFR: 60.02 mL/min (ref >60.00)
Glucose, Bld: 89 mg/dL (ref 70–99)
Potassium: 5.2 mEq/L — ABNORMAL HIGH (ref 3.5–5.1)
Sodium: 138 mEq/L (ref 135–145)
Total Bilirubin: 0.6 mg/dL (ref 0.2–1.2)
Total Protein: 7.2 g/dL (ref 6.0–8.3)

## 2022-12-24 LAB — LIPID PANEL
Cholesterol: 172 mg/dL (ref 0–200)
HDL: 52.5 mg/dL (ref >39.00)
LDL Cholesterol: 97 mg/dL (ref 0–99)
NonHDL: 119
Total CHOL/HDL Ratio: 3
Triglycerides: 111 mg/dL (ref 0.0–149.0)
VLDL: 22.2 mg/dL (ref 0.0–40.0)

## 2022-12-24 LAB — HEMOGLOBIN A1C: Hgb A1c MFr Bld: 5.9 % (ref 4.6–6.5)

## 2022-12-24 MED ORDER — OMEPRAZOLE 20 MG PO CPDR: 20.0000 mg | DELAYED_RELEASE_CAPSULE | Freq: Every day | ORAL | 3 refills | Status: DC

## 2022-12-24 MED ORDER — ROSUVASTATIN CALCIUM 10 MG PO TABS: 10.0000 mg | ORAL_TABLET | Freq: Every day | ORAL | 1 refills | Status: DC

## 2022-12-24 NOTE — Patient Instructions (Addendum)
Try omeprazole once per day - prior to meal. I think heartburn may be the cause of cough. Recheck in 1 month.  Flu vaccine when available.  Info on RSV vaccine below - can be given at your pharmacy if you would like.  Walking, biking or other low intensity exercise with goal of 150 minutes per week.  Take care!  There is a recommendation for RSV vaccine for patients over age 67.   Typically would recommend the RSV vaccine as most important for patients over age 63 that have comorbidities that put them at increased risk for severe disease (heart disease such as congestive heart failure, coronary artery disease, lung disease such as asthma or COPD, kidney disease, liver disease, diabetes, chronic or progressive neurologic or muscular conditions, immunosuppressed, or being frail or of advanced age).  For others that do not have these risk factors, there still is some benefit from vaccination since age is one of the main risk factors for developing severe disease however baseline risk of developing severe disease and requiring hospitalization is likely to be lower compared to those that have comorbidities in addition to age.   CDC does have some information as well: [employer]  Preventive Care 24 Years and Older, Female Preventive care refers to lifestyle choices and visits with your health care provider that can promote health and wellness. Preventive care visits are also called wellness exams. What can I expect for my preventive care visit? Counseling Your health care provider may ask you questions about your: Medical history, including: Past medical problems. Family medical history. Pregnancy and menstrual history. History of falls. Current health, including: Memory and ability to understand (cognition). Emotional well-being. Home life and relationship well-being. Sexual activity and sexual health. Lifestyle, including: Alcohol,  nicotine or tobacco, and drug use. Access to firearms. Diet, exercise, and sleep habits. Work and work Astronomer. Sunscreen use. Safety issues such as seatbelt and bike helmet use. Physical exam Your health care provider will check your: Height and weight. These may be used to calculate your BMI (body mass index). BMI is a measurement that tells if you are at a healthy weight. Waist circumference. This measures the distance around your waistline. This measurement also tells if you are at a healthy weight and may help predict your risk of certain diseases, such as type 2 diabetes and high blood pressure. Heart rate and blood pressure. Body temperature. Skin for abnormal spots. What immunizations do I need?  Vaccines are usually given at various ages, according to a schedule. Your health care provider will recommend vaccines for you based on your age, medical history, and lifestyle or other factors, such as travel or where you work. What tests do I need? Screening Your health care provider may recommend screening tests for certain conditions. This may include: Lipid and cholesterol levels. Hepatitis C test. Hepatitis B test. HIV (human immunodeficiency virus) test. STI (sexually transmitted infection) testing, if you are at risk. Lung cancer screening. Colorectal cancer screening. Diabetes screening. This is done by checking your blood sugar (glucose) after you have not eaten for a while (fasting). Mammogram. Talk with your health care provider about how often you should have regular mammograms. BRCA-related cancer screening. This may be done if you have a family history of breast, ovarian, tubal, or peritoneal cancers. Bone density scan. This is done to screen for osteoporosis. Talk with your health care provider about your test results, treatment options, and if necessary, the need for more tests. Follow these instructions at  home: Eating and drinking  Eat a diet that includes  fresh fruits and vegetables, whole grains, lean protein, and low-fat dairy products. Limit your intake of foods with high amounts of sugar, saturated fats, and salt. Take vitamin and mineral supplements as recommended by your health care provider. Do not drink alcohol if your health care provider tells you not to drink. If you drink alcohol: Limit how much you have to 0-1 drink a day. Know how much alcohol is in your drink. In the [place] one drink equals one 12 oz bottle of beer (355 mL), one 5 oz glass of wine (148 mL), or one 1 oz glass of hard liquor (44 mL). Lifestyle Brush your teeth every morning and night with fluoride toothpaste. Floss one time each day. Exercise for at least 30 minutes 5 or more days each week. Do not use any products that contain nicotine or tobacco. These products include cigarettes, chewing tobacco, and vaping devices, such as e-cigarettes. If you need help quitting, ask your health care provider. Do not use drugs. If you are sexually active, practice safe sex. Use a condom or other form of protection in order to prevent STIs. Take aspirin only as told by your health care provider. Make sure that you understand how much to take and what form to take. Work with your health care provider to find out whether it is safe and beneficial for you to take aspirin daily. Ask your health care provider if you need to take a cholesterol-lowering medicine (statin). Find healthy ways to manage stress, such as: Meditation, yoga, or listening to music. Journaling. Talking to a trusted person. Spending time with friends and family. Minimize exposure to UV radiation to reduce your risk of skin cancer. Safety Always wear your seat belt while driving or riding in a vehicle. Do not drive: If you have been drinking alcohol. Do not ride with someone who has been drinking. When you are tired or distracted. While texting. If you have been using any mind-altering substances or  drugs. Wear a helmet and other protective equipment during sports activities. If you have firearms in your house, make sure you follow all gun safety procedures. What's next? Visit your health care provider once a year for an annual wellness visit. Ask your health care provider how often you should have your eyes and teeth checked. Stay up to date on all vaccines. This information is not intended to replace advice given to you by your health care provider. Make sure you discuss any questions you have with your health care provider. Document Revised: 10/23/2020 Document Reviewed: 10/23/2020 Elsevier Patient Education  2024 [employer].

## 2022-12-24 NOTE — Progress Notes (Signed)
Subjective:  Patient ID: Monica Wyatt, female    DOB: [birth year]  Age: 67 y.o. MRN: [id number]  CC:  Chief Complaint  Patient presents with   Annual Exam    Pt notes since having COVID about a year ago she will have a slight cough in the morning, didn't know if there was anything she could do for this otherwise patient is well, pt is fasting     HPI Abram Sander presents for Annual Exam PCP, me Dermatology, Dr. Jorja Loa prior. No new skin lesions, no hx of skin CA.  GYN, Dr. Ernestina Penna  Cough As above, slight cough noted in the morning, COVID 1-1.5 year ago.  Discussed heartburn at her February visit, noted some increased heartburn previous few months with slight cough at that time, treated with antacids, Tums.  Trigger foods discussed, recommended Pepcid twice daily with option of PPI if uncontrolled and 6-week follow-up planned. No runny nose, congestion.  Antacids at times after eating, no recent use of pepcid. Notices more at night. Cough in am. Dry cough.   Hyperlipidemia: Crestor 10 mg daily, no side effects/myalgias.  Lab Results  Component Value Date   CHOL 174 06/26/2022   HDL 56.80 06/26/2022   LDLCALC 93 06/26/2022   TRIG 121.0 06/26/2022   CHOLHDL 3 06/26/2022   Lab Results  Component Value Date   ALT 17 06/26/2022   AST 23 06/26/2022   GGT 11 02/17/2016   ALKPHOS 68 06/26/2022   BILITOT 0.6 06/26/2022   Prediabetes: Diet/exercise approach.  Weight stable. Avoids sugar beverages.  Some takeout at times - few times per week.  Exercise: minimal - plans to restart.  Lab Results  Component Value Date   HGBA1C 5.9 06/26/2022   Wt Readings from Last 3 Encounters:  12/24/22 136 lb 9.6 oz (62 kg)  06/26/22 135 lb 2 oz (61.3 kg)  12/19/21 133 lb (60.3 kg)        12/24/2022    8:10 AM 06/26/2022    8:24 AM 12/19/2021    8:06 AM 05/22/2021    2:54 PM 12/12/2020    9:36 AM  Depression screen PHQ 2/9  Decreased Interest 0 0 0 0 0  Down, Depressed, Hopeless  0 0 0 0 0  PHQ - 2 Score 0 0 0 0 0  Altered sleeping 1  0 1 0  Tired, decreased energy 0  0 0 0  Change in appetite 0  0 0 0  Feeling bad or failure about yourself  0  0 0 0  Trouble concentrating 0  0 0 0  Moving slowly or fidgety/restless 0  0 0 0  Suicidal thoughts 0  0 0 0  PHQ-9 Score 1  0 1 0    Cologuard negative 12/22/2020. Mammogram September 2023.  Pap testing with GYN, last appointment September 2023.   Health Maintenance  Topic Date Due   Medicare Annual Wellness (AWV)  Never done   Hepatitis C Screening  Never done   Zoster Vaccines- Shingrix (1 of 2) Never done   Colonoscopy  Never done   COVID-19 Vaccine (4 - 2023-24 season) 01/09/2022   INFLUENZA VACCINE  12/10/2022   MAMMOGRAM  01/30/2023   DTaP/Tdap/Td (2 - Td or Tdap) 02/06/2027   Pneumonia Vaccine 61+ Years old  Completed   DEXA SCAN  Completed   HPV VACCINES  Aged Out  Covid vaccine - declines.  Recommended flu vaccine when available.  Shingles vaccine - declines.   Immunization  History  Administered Date(s) Administered   Fluad Quad(high Dose 65+) 06/26/2022   Influenza-Unspecified 02/22/2020, 03/26/2021   Moderna Sars-Covid-2 Vaccination 05/21/2020   PFIZER(Purple Top)SARS-COV-2 Vaccination 08/31/2019, 09/21/2019   PNEUMOCOCCAL CONJUGATE-20 05/22/2021   Tdap 02/05/2017    No results found. Wears glasses. Optho about a year ago.   Dental: recent visit, ongoing care.   Alcohol: none  Tobacco: none  Exercise:none - plans to increase    History Patient Active Problem List   Diagnosis Date Noted   Healthcare maintenance 10/14/2017   Insomnia 10/14/2017   Pre-diabetes 10/14/2017   Screening for colon cancer 10/14/2017   Osteoporosis 03/18/2017   Hyperlipidemia 06/08/2016   Chronic tension-type headache, not intractable 06/08/2016   Past Medical History:  Diagnosis Date   GERD (gastroesophageal reflux disease)    Headache(784.0)    Hyperlipidemia    Medical history non-contributory     Osteoporosis    Past Surgical History:  Procedure Laterality Date   HERNIA REPAIR     HYSTEROSCOPY WITH D & C N/A 07/22/2012   Procedure: DILATATION AND CURETTAGE /HYSTEROSCOPY;  Surgeon: Alphonsus Sias. Ernestina Penna, MD;  Location: [facility];  Service: Gynecology;  Laterality: N/A;   LAPAROSCOPY N/A 07/22/2012   Procedure: LAPAROSCOPY OPERATIVE;  Surgeon: Tresa Endo A. Ernestina Penna, MD;  Location: [facility];  Service: Gynecology;  Laterality: N/A;   NO PAST SURGERIES     SALPINGOOPHORECTOMY Bilateral 07/22/2012   Procedure: SALPINGO OOPHORECTOMY;  Surgeon: Tresa Endo A. Ernestina Penna, MD;  Location: [facility];  Service: Gynecology;  Laterality: Bilateral;   Allergies  Allergen Reactions   Penicillins Hives    Childhood reaction   Prior to Admission medications   Medication Sig Start Date End Date Taking? Authorizing Provider  Cholecalciferol (VITAMIN D3) 2000 units TABS Take 1 tablet by mouth daily.   Yes [provider]  famotidine (PEPCID) 10 MG tablet Take 1 tablet (10 mg total) by mouth 2 (two) times daily. 06/26/22  Yes Shade Flood, MD  rosuvastatin (CRESTOR) 10 MG tablet Take 1 tablet (10 mg total) by mouth daily. 06/26/22  Yes Shade Flood, MD   Social History   Socioeconomic History   Marital status: Married    Spouse name: Not on file   Number of children: Not on file   Years of education: Not on file   Highest education level: Not on file  Occupational History   Not on file  Tobacco Use   Smoking status: Never   Smokeless tobacco: Never  Vaping Use   Vaping status: Never Used  Substance and Sexual Activity   Alcohol use: Not Currently   Drug use: No   Sexual activity: Not Currently    Birth control/protection: Surgical  Other Topics Concern   Not on file  Social History Narrative   Not on file   Social Determinants of Health   Financial Resource Strain: Not on file  Food Insecurity: Not on file  Transportation Needs: Not on file  Physical Activity: Not on file  Stress:  Not on file  Social Connections: Unknown (09/23/2021)   Received from [facility], [facility]   Social Network    Social Network: Not on file  Intimate Partner Violence: Unknown (08/15/2021)   Received from [facility], [facility]   HITS    Physically Hurt: Not on file    Insult or Talk Down To: Not on file    Threaten Physical Harm: Not on file    Scream or Curse: Not on file  Review of Systems 13 point review of systems per patient health survey noted.  Negative other than as indicated above or in HPI.    Objective:   Vitals:   12/24/22 0812  BP: 116/68  Pulse: 64  Temp: 97.9 F (36.6 C)  TempSrc: Temporal  SpO2: 98%  Weight: 136 lb 9.6 oz (62 kg)  Height: 5\' 4"  (1.626 m)     Physical Exam Constitutional:      Appearance: She is well-developed.  HENT:     Head: Normocephalic and atraumatic.     Right Ear: External ear normal.     Left Ear: External ear normal.  Eyes:     Conjunctiva/sclera: Conjunctivae normal.     Pupils: Pupils are equal, round, and reactive to light.  Neck:     Thyroid: No thyromegaly.  Cardiovascular:     Rate and Rhythm: Normal rate and regular rhythm.     Heart sounds: Normal heart sounds. No murmur heard. Pulmonary:     Effort: Pulmonary effort is normal. No respiratory distress.     Breath sounds: Normal breath sounds. No wheezing.  Abdominal:     General: Bowel sounds are normal.     Palpations: Abdomen is soft.     Tenderness: There is no abdominal tenderness.  Musculoskeletal:        General: No tenderness. Normal range of motion.     Cervical back: Normal range of motion and neck supple.  Lymphadenopathy:     Cervical: No cervical adenopathy.  Skin:    General: Skin is warm and dry.     Findings: No rash.  Neurological:     Mental Status: She is alert and oriented to person, place, and time.  Psychiatric:        Behavior: Behavior normal.        Thought Content: Thought content normal.        Assessment  & Plan:  GOLDENE FORTENBERRY is a 67 y.o. female . Annual physical exam  - -anticipatory guidance as below in AVS, screening labs above. Health maintenance items as above in HPI discussed/recommended as applicable.   Pre-diabetes - Plan: Comprehensive metabolic panel, Hemoglobin A1c  -Continue to watch diet, increase exercise plan, check A1c.  Hyperlipidemia, unspecified hyperlipidemia type - Plan: Comprehensive metabolic panel, Lipid panel, rosuvastatin (CRESTOR) 10 MG tablet  -  Stable, tolerating current regimen. Medications refilled. Labs pending as above.   Need for hepatitis C screening test - Plan: Hepatitis C Antibody  Screening for deficiency anemia - Plan: CBC with Differential/Platelet  Heartburn - Plan: omeprazole (PRILOSEC) 20 MG capsule Subacute cough - Plan: omeprazole (PRILOSEC) 20 MG capsule  -Morning, likely related to heartburn/reflux, especially with more nighttime symptoms of heartburn.  Lungs clear, no concerning symptoms otherwise.  No recent changes in cough.  Trial of Prilosec once per day for now and recheck in 1 month.  Meds ordered this encounter  Medications   rosuvastatin (CRESTOR) 10 MG tablet    Sig: Take 1 tablet (10 mg total) by mouth daily.    Dispense:  90 tablet    Refill:  1   omeprazole (PRILOSEC) 20 MG capsule    Sig: Take 1 capsule (20 mg total) by mouth daily.    Dispense:  30 capsule    Refill:  3   Patient Instructions  Try omeprazole once per day - prior to meal. I think heartburn may be the cause of cough. Recheck in 1 month.  Flu vaccine when  available.  Info on RSV vaccine below - can be given at your pharmacy if you would like.  Walking, biking or other low intensity exercise with goal of 150 minutes per week.  Take care!  There is a recommendation for RSV vaccine for patients over age 32.   Typically would recommend the RSV vaccine as most important for patients over age 11 that have comorbidities that put them at increased risk  for severe disease (heart disease such as congestive heart failure, coronary artery disease, lung disease such as asthma or COPD, kidney disease, liver disease, diabetes, chronic or progressive neurologic or muscular conditions, immunosuppressed, or being frail or of advanced age).  For others that do not have these risk factors, there still is some benefit from vaccination since age is one of the main risk factors for developing severe disease however baseline risk of developing severe disease and requiring hospitalization is likely to be lower compared to those that have comorbidities in addition to age.   CDC does have some information as well: [employer]  Preventive Care 60 Years and Older, Female Preventive care refers to lifestyle choices and visits with your health care provider that can promote health and wellness. Preventive care visits are also called wellness exams. What can I expect for my preventive care visit? Counseling Your health care provider may ask you questions about your: Medical history, including: Past medical problems. Family medical history. Pregnancy and menstrual history. History of falls. Current health, including: Memory and ability to understand (cognition). Emotional well-being. Home life and relationship well-being. Sexual activity and sexual health. Lifestyle, including: Alcohol, nicotine or tobacco, and drug use. Access to firearms. Diet, exercise, and sleep habits. Work and work Astronomer. Sunscreen use. Safety issues such as seatbelt and bike helmet use. Physical exam Your health care provider will check your: Height and weight. These may be used to calculate your BMI (body mass index). BMI is a measurement that tells if you are at a healthy weight. Waist circumference. This measures the distance around your waistline. This measurement also tells if you are at a healthy weight and may help predict  your risk of certain diseases, such as type 2 diabetes and high blood pressure. Heart rate and blood pressure. Body temperature. Skin for abnormal spots. What immunizations do I need?  Vaccines are usually given at various ages, according to a schedule. Your health care provider will recommend vaccines for you based on your age, medical history, and lifestyle or other factors, such as travel or where you work. What tests do I need? Screening Your health care provider may recommend screening tests for certain conditions. This may include: Lipid and cholesterol levels. Hepatitis C test. Hepatitis B test. HIV (human immunodeficiency virus) test. STI (sexually transmitted infection) testing, if you are at risk. Lung cancer screening. Colorectal cancer screening. Diabetes screening. This is done by checking your blood sugar (glucose) after you have not eaten for a while (fasting). Mammogram. Talk with your health care provider about how often you should have regular mammograms. BRCA-related cancer screening. This may be done if you have a family history of breast, ovarian, tubal, or peritoneal cancers. Bone density scan. This is done to screen for osteoporosis. Talk with your health care provider about your test results, treatment options, and if necessary, the need for more tests. Follow these instructions at home: Eating and drinking  Eat a diet that includes fresh fruits and vegetables, whole grains, lean protein, and low-fat dairy products. Limit your intake of  foods with high amounts of sugar, saturated fats, and salt. Take vitamin and mineral supplements as recommended by your health care provider. Do not drink alcohol if your health care provider tells you not to drink. If you drink alcohol: Limit how much you have to 0-1 drink a day. Know how much alcohol is in your drink. In the [place] one drink equals one 12 oz bottle of beer (355 mL), one 5 oz glass of wine (148 mL), or one 1 oz  glass of hard liquor (44 mL). Lifestyle Brush your teeth every morning and night with fluoride toothpaste. Floss one time each day. Exercise for at least 30 minutes 5 or more days each week. Do not use any products that contain nicotine or tobacco. These products include cigarettes, chewing tobacco, and vaping devices, such as e-cigarettes. If you need help quitting, ask your health care provider. Do not use drugs. If you are sexually active, practice safe sex. Use a condom or other form of protection in order to prevent STIs. Take aspirin only as told by your health care provider. Make sure that you understand how much to take and what form to take. Work with your health care provider to find out whether it is safe and beneficial for you to take aspirin daily. Ask your health care provider if you need to take a cholesterol-lowering medicine (statin). Find healthy ways to manage stress, such as: Meditation, yoga, or listening to music. Journaling. Talking to a trusted person. Spending time with friends and family. Minimize exposure to UV radiation to reduce your risk of skin cancer. Safety Always wear your seat belt while driving or riding in a vehicle. Do not drive: If you have been drinking alcohol. Do not ride with someone who has been drinking. When you are tired or distracted. While texting. If you have been using any mind-altering substances or drugs. Wear a helmet and other protective equipment during sports activities. If you have firearms in your house, make sure you follow all gun safety procedures. What's next? Visit your health care provider once a year for an annual wellness visit. Ask your health care provider how often you should have your eyes and teeth checked. Stay up to date on all vaccines. This information is not intended to replace advice given to you by your health care provider. Make sure you discuss any questions you have with your health care provider. Document  Revised: 10/23/2020 Document Reviewed: 10/23/2020 Elsevier Patient Education  2024 Elsevier Inc.      Signed,   Meredith Staggers, MD [facility], [facility] 12/24/22 8:42 AM

## 2022-12-25 LAB — HEPATITIS C ANTIBODY: Hepatitis C Ab: NONREACTIVE

## 2023-01-25 ENCOUNTER — Encounter: Payer: Self-pay | Admitting: Family Medicine

## 2023-01-25 ENCOUNTER — Ambulatory Visit (INDEPENDENT_AMBULATORY_CARE_PROVIDER_SITE_OTHER): Payer: Medicare HMO | Admitting: Family Medicine

## 2023-01-25 VITALS — BP 104/68 | HR 62 | Temp 97.9°F | Wt 137.2 lb

## 2023-01-25 DIAGNOSIS — R12 Heartburn: Secondary | ICD-10-CM | POA: Diagnosis not present

## 2023-01-25 DIAGNOSIS — E875 Hyperkalemia: Secondary | ICD-10-CM | POA: Diagnosis not present

## 2023-01-25 DIAGNOSIS — R052 Subacute cough: Secondary | ICD-10-CM

## 2023-01-25 LAB — BASIC METABOLIC PANEL
BUN: 17 mg/dL (ref 6–23)
CO2: 28 meq/L (ref 19–32)
Calcium: 9.4 mg/dL (ref 8.4–10.5)
Chloride: 104 meq/L (ref 96–112)
Creatinine, Ser: 0.96 mg/dL (ref 0.40–1.20)
GFR: 61.48 mL/min (ref >60.00)
Glucose, Bld: 89 mg/dL (ref 70–99)
Potassium: 4.8 meq/L (ref 3.5–5.1)
Sodium: 140 meq/L (ref 135–145)

## 2023-01-25 NOTE — Progress Notes (Signed)
Subjective:  Patient ID: Monica Wyatt, female    DOB: 20-Feb-1956  Age: 67 y.o. MRN: [id number]  CC:  Chief Complaint  Patient presents with   Medical Management of Chronic Issues    F/u for prilosec -  feels it is working, does not cough as much     HPI Monica Wyatt presents for   Follow-up cough Discussed at her physical August 15.  Was having increased heartburn for a few months with slight cough earlier in the year treated with antacids and Tums, trigger foods were discussed and recommended Pepcid along with PPI.  As of last visit she had been using antacids at times after eating but no recent use of Pepcid.  Some heartburn at night with cough in the morning, dry cough, thought to be reflux related.  Added omeprazole once per day.   Since last visit taking omeprazole - prior to dinner. Cough has improved - still rare cough in am but better. Heartburn has improved - only used tums once when forgot omeprazole has stopped pepcid.   No fever, dyspnea, unexplained weigh loss or night sweats.  No hemoptysis.   Hyperkalemia: Borderline on recent labs.  Similar to previous lab in February, with reading of 5.2. No known potassium in MVI.   At end of visit noted some stiffness in her left shoulder in the morning, has been doing some home exercises.  Improved.  No known injury.  Plans to follow-up if not continuing to improve.  History Patient Active Problem List   Diagnosis Date Noted   Healthcare maintenance 10/14/2017   Insomnia 10/14/2017   Pre-diabetes 10/14/2017   Screening for colon cancer 10/14/2017   Osteoporosis 03/18/2017   Hyperlipidemia 06/08/2016   Chronic tension-type headache, not intractable 06/08/2016   Past Medical History:  Diagnosis Date   GERD (gastroesophageal reflux disease)    Headache(784.0)    Hyperlipidemia    Medical history non-contributory    Osteoporosis    Past Surgical History:  Procedure Laterality Date   HERNIA REPAIR      HYSTEROSCOPY WITH D & C N/A 07/22/2012   Procedure: DILATATION AND CURETTAGE /HYSTEROSCOPY;  Surgeon: Alphonsus Sias. Ernestina Penna, MD;  Location: [facility];  Service: Gynecology;  Laterality: N/A;   LAPAROSCOPY N/A 07/22/2012   Procedure: LAPAROSCOPY OPERATIVE;  Surgeon: Tresa Endo A. Ernestina Penna, MD;  Location: [facility];  Service: Gynecology;  Laterality: N/A;   NO PAST SURGERIES     SALPINGOOPHORECTOMY Bilateral 07/22/2012   Procedure: SALPINGO OOPHORECTOMY;  Surgeon: Tresa Endo A. Ernestina Penna, MD;  Location: [facility];  Service: Gynecology;  Laterality: Bilateral;   Allergies  Allergen Reactions   Penicillins Hives    Childhood reaction   Prior to Admission medications   Medication Sig Start Date End Date Taking? Authorizing Provider  Cholecalciferol (VITAMIN D3) 2000 units TABS Take 1 tablet by mouth daily.   Yes [provider]  famotidine (PEPCID) 10 MG tablet Take 1 tablet (10 mg total) by mouth 2 (two) times daily. 06/26/22  Yes Shade Flood, MD  omeprazole (PRILOSEC) 20 MG capsule Take 1 capsule (20 mg total) by mouth daily. 12/24/22  Yes Shade Flood, MD  rosuvastatin (CRESTOR) 10 MG tablet Take 1 tablet (10 mg total) by mouth daily. 12/24/22  Yes Shade Flood, MD   Social History   Socioeconomic History   Marital status: Married    Spouse name: Not on file   Number of children: Not on file   Years of education: Not  on file   Highest education level: GED or equivalent  Occupational History   Not on file  Tobacco Use   Smoking status: Never   Smokeless tobacco: Never  Vaping Use   Vaping status: Never Used  Substance and Sexual Activity   Alcohol use: Not Currently   Drug use: No   Sexual activity: Not Currently    Birth control/protection: Surgical  Other Topics Concern   Not on file  Social History Narrative   Not on file   Social Determinants of Health   Financial Resource Strain: Patient Declined (01/21/2023)   Overall Financial Resource Strain (CARDIA)    Difficulty of  Paying Living Expenses: Patient declined  Food Insecurity: No Food Insecurity (01/21/2023)   Hunger Vital Sign    Worried About Running Out of Food in the Last Year: Never true    Ran Out of Food in the Last Year: Never true  Transportation Needs: No Transportation Needs (01/21/2023)   PRAPARE - Administrator, Civil Service (Medical): No    Lack of Transportation (Non-Medical): No  Physical Activity: Unknown (01/21/2023)   Exercise Vital Sign    Days of Exercise per Week: 2 days    Minutes of Exercise per Session: Patient declined  Stress: No Stress Concern Present (01/21/2023)   [employer] of Occupational Health - Occupational Stress Questionnaire    Feeling of Stress : Not at all  Social Connections: Unknown (01/21/2023)   Social Connection and Isolation Panel [NHANES]    Frequency of Communication with Friends and Family: Once a week    Frequency of Social Gatherings with Friends and Family: Patient declined    Attends Religious Services: Patient declined    Database administrator or Organizations: No    Attends Engineer, structural: Not on file    Marital Status: Married  Intimate Partner Violence: Unknown (08/15/2021)   Received from [employer], [facility]   HITS    Physically Hurt: Not on file    Insult or Talk Down To: Not on file    Threaten Physical Harm: Not on file    Scream or Curse: Not on file    Review of Systems Per HPI.   Objective:   Vitals:   01/25/23 0858  BP: 104/68  Pulse: 62  Temp: 97.9 F (36.6 C)  TempSrc: Temporal  SpO2: 96%  Weight: 137 lb 3.2 oz (62.2 kg)     Physical Exam Vitals reviewed.  Constitutional:      Appearance: Normal appearance. She is well-developed.  HENT:     Head: Normocephalic and atraumatic.  Eyes:     Conjunctiva/sclera: Conjunctivae normal.     Pupils: Pupils are equal, round, and reactive to light.  Neck:     Vascular: No carotid bruit.  Cardiovascular:     Rate and Rhythm:  Normal rate and regular rhythm.     Heart sounds: Normal heart sounds.  Pulmonary:     Effort: Pulmonary effort is normal. No respiratory distress.     Breath sounds: Normal breath sounds. No stridor. No wheezing, rhonchi or rales.  Abdominal:     Palpations: Abdomen is soft. There is no pulsatile mass.     Tenderness: There is no abdominal tenderness.  Musculoskeletal:     Right lower leg: No edema.     Left lower leg: No edema.  Skin:    General: Skin is warm and dry.  Neurological:     Mental Status: She is alert  and oriented to person, place, and time.  Psychiatric:        Mood and Affect: Mood normal.        Behavior: Behavior normal.        Assessment & Plan:  Monica Wyatt is a 67 y.o. female . Subacute cough Heartburn  -Improved cough with treatment heartburn, suspected upper airway cough/irritation.  Recommended taking omeprazole prior to lunch and then adding Pepcid at bedtime with RTC precautions if cough does not resolve.  Lungs clear on exam, no concerning symptoms.  Hyperkalemia - Plan: Basic metabolic panel  -Borderline, repeat BMP, stop any potassium supplements and multivitamins.  Plans to follow-up if not having continued improvement with left shoulder symptoms.  Exam deferred today.  No orders of the defined types were placed in this encounter.  Patient Instructions  Thank you for coming back in today.  I am glad to hear the cough has improved with treatment of heartburn.  Try taking the omeprazole 30 minutes prior to lunch and you can take a Pepcid at bedtime to see if that completely resolves heartburn and cough.  If cough does not resolve in the next few weeks, let me know and I can order a chest x-ray or we can look into other causes if needed.  I will check your electrolytes again as potassium was borderline elevated previously, check your multivitamins again and make sure there is no potassium supplement and if there is, stop that  multivitamin.  Please follow-up if the range of motion exercises at home do not help your shoulder. Return to the clinic or go to the nearest emergency room if any of your symptoms worsen or new symptoms occur.     Signed,   Meredith Staggers, MD [facility], [facility] 01/25/23 9:28 AM

## 2023-01-25 NOTE — Patient Instructions (Signed)
Thank you for coming back in today.  I am glad to hear the cough has improved with treatment of heartburn.  Try taking the omeprazole 30 minutes prior to lunch and you can take a Pepcid at bedtime to see if that completely resolves heartburn and cough.  If cough does not resolve in the next few weeks, let me know and I can order a chest x-ray or we can look into other causes if needed.  I will check your electrolytes again as potassium was borderline elevated previously, check your multivitamins again and make sure there is no potassium supplement and if there is, stop that multivitamin.  Please follow-up if the range of motion exercises at home do not help your shoulder. Return to the clinic or go to the nearest emergency room if any of your symptoms worsen or new symptoms occur.

## 2023-03-10 DIAGNOSIS — Z809 Family history of malignant neoplasm, unspecified: Secondary | ICD-10-CM | POA: Diagnosis not present

## 2023-03-10 DIAGNOSIS — Z880 Allergy status to penicillin: Secondary | ICD-10-CM | POA: Diagnosis not present

## 2023-03-10 DIAGNOSIS — Z833 Family history of diabetes mellitus: Secondary | ICD-10-CM | POA: Diagnosis not present

## 2023-03-10 DIAGNOSIS — Z008 Encounter for other general examination: Secondary | ICD-10-CM | POA: Diagnosis not present

## 2023-03-10 DIAGNOSIS — R030 Elevated blood-pressure reading, without diagnosis of hypertension: Secondary | ICD-10-CM | POA: Diagnosis not present

## 2023-03-10 DIAGNOSIS — M810 Age-related osteoporosis without current pathological fracture: Secondary | ICD-10-CM | POA: Diagnosis not present

## 2023-03-10 DIAGNOSIS — K219 Gastro-esophageal reflux disease without esophagitis: Secondary | ICD-10-CM | POA: Diagnosis not present

## 2023-03-10 DIAGNOSIS — E785 Hyperlipidemia, unspecified: Secondary | ICD-10-CM | POA: Diagnosis not present

## 2023-03-23 ENCOUNTER — Other Ambulatory Visit: Payer: Self-pay | Admitting: Family Medicine

## 2023-03-23 DIAGNOSIS — R12 Heartburn: Secondary | ICD-10-CM

## 2023-03-23 DIAGNOSIS — R052 Subacute cough: Secondary | ICD-10-CM

## 2023-04-29 LAB — HM MAMMOGRAPHY

## 2023-05-06 DIAGNOSIS — M810 Age-related osteoporosis without current pathological fracture: Secondary | ICD-10-CM | POA: Diagnosis not present

## 2023-09-02 ENCOUNTER — Other Ambulatory Visit: Payer: Self-pay | Admitting: Family Medicine

## 2023-09-02 DIAGNOSIS — E785 Hyperlipidemia, unspecified: Secondary | ICD-10-CM

## 2023-09-23 DIAGNOSIS — H2513 Age-related nuclear cataract, bilateral: Secondary | ICD-10-CM | POA: Diagnosis not present

## 2023-12-30 ENCOUNTER — Ambulatory Visit (INDEPENDENT_AMBULATORY_CARE_PROVIDER_SITE_OTHER): Payer: Medicare HMO | Admitting: Family Medicine

## 2023-12-30 ENCOUNTER — Encounter: Payer: Self-pay | Admitting: Family Medicine

## 2023-12-30 VITALS — BP 110/74 | HR 61 | Temp 97.9°F | Resp 15 | Ht 64.27 in | Wt 134.6 lb

## 2023-12-30 DIAGNOSIS — E785 Hyperlipidemia, unspecified: Secondary | ICD-10-CM

## 2023-12-30 DIAGNOSIS — R12 Heartburn: Secondary | ICD-10-CM | POA: Diagnosis not present

## 2023-12-30 DIAGNOSIS — R7303 Prediabetes: Secondary | ICD-10-CM | POA: Diagnosis not present

## 2023-12-30 DIAGNOSIS — R052 Subacute cough: Secondary | ICD-10-CM

## 2023-12-30 DIAGNOSIS — Z0000 Encounter for general adult medical examination without abnormal findings: Secondary | ICD-10-CM | POA: Diagnosis not present

## 2023-12-30 DIAGNOSIS — Z1211 Encounter for screening for malignant neoplasm of colon: Secondary | ICD-10-CM

## 2023-12-30 LAB — COMPREHENSIVE METABOLIC PANEL WITH GFR
ALT: 13 U/L (ref 0–35)
AST: 17 U/L (ref 0–37)
Albumin: 4.5 g/dL (ref 3.5–5.2)
Alkaline Phosphatase: 71 U/L (ref 39–117)
BUN: 15 mg/dL (ref 6–23)
CO2: 28 meq/L (ref 19–32)
Calcium: 9.4 mg/dL (ref 8.4–10.5)
Chloride: 105 meq/L (ref 96–112)
Creatinine, Ser: 0.96 mg/dL (ref 0.40–1.20)
GFR: 61.08 mL/min (ref >60.00)
Glucose, Bld: 86 mg/dL (ref 70–99)
Potassium: 4.5 meq/L (ref 3.5–5.1)
Sodium: 142 meq/L (ref 135–145)
Total Bilirubin: 0.6 mg/dL (ref 0.2–1.2)
Total Protein: 7.1 g/dL (ref 6.0–8.3)

## 2023-12-30 LAB — LIPID PANEL
Cholesterol: 162 mg/dL (ref 0–200)
HDL: 51.4 mg/dL (ref >39.00)
LDL Cholesterol: 88 mg/dL (ref 0–99)
NonHDL: 110.58
Total CHOL/HDL Ratio: 3
Triglycerides: 114 mg/dL (ref 0.0–149.0)
VLDL: 22.8 mg/dL (ref 0.0–40.0)

## 2023-12-30 LAB — CBC
HCT: 41.6 % (ref 36.0–46.0)
Hemoglobin: 13.9 g/dL (ref 12.0–15.0)
MCHC: 33.4 g/dL (ref 30.0–36.0)
MCV: 93 fl (ref 78.0–100.0)
Platelets: 210 K/uL (ref 150.0–400.0)
RBC: 4.47 Mil/uL (ref 3.87–5.11)
RDW: 13 % (ref 11.5–15.5)
WBC: 4.2 K/uL (ref 4.0–10.5)

## 2023-12-30 LAB — HEMOGLOBIN A1C: Hgb A1c MFr Bld: 6.1 % (ref 4.6–6.5)

## 2023-12-30 MED ORDER — OMEPRAZOLE 20 MG PO CPDR: 20.0000 mg | DELAYED_RELEASE_CAPSULE | Freq: Every day | ORAL | 1 refills | Status: AC

## 2023-12-30 MED ORDER — ROSUVASTATIN CALCIUM 10 MG PO TABS: 10.0000 mg | ORAL_TABLET | Freq: Every day | ORAL | 1 refills | Status: AC

## 2023-12-30 NOTE — Patient Instructions (Addendum)
 I would recommend meeting with gastroenterology, but if you want to try taking omeprazole  daily for now, chew foods thoroughly and drinking fluids with meals may help. We will recheck in 1 month.  Return to the clinic or go to the nearest emergency room if any of your symptoms worsen or new symptoms occur.  No other med changes at this time.  Goal of exercise is 150 minutes/week spread out over most days.  Return to walking, biking when able.  Any activities better than that.  Start low and go slow with the intensity of exercise.  If any concerns on labs I will let you know.  Thank you for coming in today.   Health Maintenance After Age 25 After age 75, you are at a higher risk for certain long-term diseases and infections as well as injuries from falls. Falls are a major cause of broken bones and head injuries in people who are older than age 22. Getting regular preventive care can help to keep you healthy and well. Preventive care includes getting regular testing and making lifestyle changes as recommended by your health care provider. Talk with your health care provider about: Which screenings and tests you should have. A screening is a test that checks for a disease when you have no symptoms. A diet and exercise plan that is right for you. What should I know about screenings and tests to prevent falls? Screening and testing are the best ways to find a health problem early. Early diagnosis and treatment give you the best chance of managing medical conditions that are common after age 72. Certain conditions and lifestyle choices may make you more likely to have a fall. Your health care provider may recommend: Regular vision checks. Poor vision and conditions such as cataracts can make you more likely to have a fall. If you wear glasses, make sure to get your prescription updated if your vision changes. Medicine review. Work with your health care provider to regularly review all of the medicines you  are taking, including over-the-counter medicines. Ask your health care provider about any side effects that may make you more likely to have a fall. Tell your health care provider if any medicines that you take make you feel dizzy or sleepy. Strength and balance checks. Your health care provider may recommend certain tests to check your strength and balance while standing, walking, or changing positions. Foot health exam. Foot pain and numbness, as well as not wearing proper footwear, can make you more likely to have a fall. Screenings, including: Osteoporosis screening. Osteoporosis is a condition that causes the bones to get weaker and break more easily. Blood pressure screening. Blood pressure changes and medicines to control blood pressure can make you feel dizzy. Depression screening. You may be more likely to have a fall if you have a fear of falling, feel depressed, or feel unable to do activities that you used to do. Alcohol use screening. Using too much alcohol can affect your balance and may make you more likely to have a fall. Follow these instructions at home: Lifestyle Do not drink alcohol if: Your health care provider tells you not to drink. If you drink alcohol: Limit how much you have to: 0-1 drink a day for women. 0-2 drinks a day for men. Know how much alcohol is in your drink. In the [place] one drink equals one 12 oz bottle of beer (355 mL), one 5 oz glass of wine (148 mL), or one 1 oz glass of  hard liquor (44 mL). Do not use any products that contain nicotine or tobacco. These products include cigarettes, chewing tobacco, and vaping devices, such as e-cigarettes. If you need help quitting, ask your health care provider. Activity  Follow a regular exercise program to stay fit. This will help you maintain your balance. Ask your health care provider what types of exercise are appropriate for you. If you need a cane or walker, use it as recommended by your health care  provider. Wear supportive shoes that have nonskid soles. Safety  Remove any tripping hazards, such as rugs, cords, and clutter. Install safety equipment such as grab bars in bathrooms and safety rails on stairs. Keep rooms and walkways well-lit. General instructions Talk with your health care provider about your risks for falling. Tell your health care provider if: You fall. Be sure to tell your health care provider about all falls, even ones that seem minor. You feel dizzy, tiredness (fatigue), or off-balance. Take over-the-counter and prescription medicines only as told by your health care provider. These include supplements. Eat a healthy diet and maintain a healthy weight. A healthy diet includes low-fat dairy products, low-fat (lean) meats, and fiber from whole grains, beans, and lots of fruits and vegetables. Stay current with your vaccines. Schedule regular health, dental, and eye exams. Summary Having a healthy lifestyle and getting preventive care can help to protect your health and wellness after age 59. Screening and testing are the best way to find a health problem early and help you avoid having a fall. Early diagnosis and treatment give you the best chance for managing medical conditions that are more common for people who are older than age 76. Falls are a major cause of broken bones and head injuries in people who are older than age 65. Take precautions to prevent a fall at home. Work with your health care provider to learn what changes you can make to improve your health and wellness and to prevent falls. This information is not intended to replace advice given to you by your health care provider. Make sure you discuss any questions you have with your health care provider. Document Revised: 09/16/2020 Document Reviewed: 09/16/2020 Elsevier Patient Education  2024 [employer].

## 2023-12-30 NOTE — Progress Notes (Signed)
 Subjective:  Patient ID: Monica Wyatt, female    DOB: 1955-08-15  Age: 68 y.o. MRN: [id number]  CC:  Chief Complaint  Patient presents with   Annual Exam    No questions or concerns    HPI [name] presents for Annual Exam   PCP, me GYN, Dr. Kandyce. Dermatology, previously treated by Dr. Livingston, no history of skin cancer or new skin lesions  GERD: Treated with omeprazole  - takes 4 days per week. Not on pepcid . Otc tums at times - 2 times per week.  Some soreness in upper chest with swallowing at times. Noticed past few months. Notices with hamburger. Only on occasion. No regurgitation.  Better with drinking fluids at the time. No unexplained weight loss or night sweats.   Hyperlipidemia: Crestor  10 mg daily without new side effects or myalgias. Lab Results  Component Value Date   CHOL 172 12/24/2022   HDL 52.50 12/24/2022   LDLCALC 97 12/24/2022   TRIG 111.0 12/24/2022   CHOLHDL 3 12/24/2022   Lab Results  Component Value Date   ALT 16 12/24/2022   AST 22 12/24/2022   GGT 11 02/17/2016   ALKPHOS 69 12/24/2022   BILITOT 0.6 12/24/2022    Prediabetes: Diet/exercise approach, weight down 3 pounds today from September last year. Watching diet.  No regular exercise.  Lab Results  Component Value Date   HGBA1C 5.9 12/24/2022   Wt Readings from Last 3 Encounters:  12/30/23 134 lb 9.6 oz (61.1 kg)  01/25/23 137 lb 3.2 oz (62.2 kg)  12/24/22 136 lb 9.6 oz (62 kg)       12/30/2023    8:17 AM 12/24/2022    8:10 AM 06/26/2022    8:24 AM 12/19/2021    8:06 AM 05/22/2021    2:54 PM  Depression screen PHQ 2/9  Decreased Interest 0 0 0 0 0  Down, Depressed, Hopeless 0 0 0 0 0  PHQ - 2 Score 0 0 0 0 0  Altered sleeping 1 1  0 1  Tired, decreased energy 0 0  0 0  Change in appetite 0 0  0 0  Feeling bad or failure about yourself  0 0  0 0  Trouble concentrating 0 0  0 0  Moving slowly or fidgety/restless 0 0  0 0  Suicidal thoughts 0 0  0 0  PHQ-9 Score  1 1  0 1  Difficult doing work/chores Not difficult at all        Health Maintenance  Topic Date Due   Medicare Annual Wellness (AWV)  Never done   Colonoscopy  Never done   Zoster Vaccines- Shingrix (1 of 2) Never done   COVID-19 Vaccine (4 - 2024-25 season) 01/10/2023   INFLUENZA VACCINE  12/10/2023   MAMMOGRAM  04/28/2025   DTaP/Tdap/Td (2 - Td or Tdap) 02/06/2027   Pneumococcal Vaccine: 50+ Years  Completed   DEXA SCAN  Completed   Hepatitis C Screening  Completed   HPV VACCINES  Aged Out   Meningococcal B Vaccine  Aged Out  Cologuard negative in 2022, due for repeat. Screening options with colonoscopy versus Cologuard discussed. Discussed timing of repeat testing intervals if normal, as well as potential need for diagnostic Colonoscopy if positive Cologuard. Understanding expressed, and chose Cologuard.   Followed by GYN for Pap testing, visit in 23.  Mammogram 04/29/2023.  Recent with GYN - will request record.  Prior tx for osteoporosis - off meds for now.  Immunization History  Administered Date(s) Administered   Fluad Quad(high Dose 65+) 06/26/2022   Influenza-Unspecified 02/22/2020, 03/26/2021   Moderna Sars-Covid-2 Vaccination 05/21/2020   PFIZER(Purple Top)SARS-COV-2 Vaccination 08/31/2019, 09/21/2019   PNEUMOCOCCAL CONJUGATE-20 05/22/2021   Tdap 02/05/2017  Flu and covid booster recommended.   No results found. Wears glasses. Optho in past year.   Dental: recent visit. Regular care every 6 monhths  Alcohol: none  Tobacco: none  Exercise: minimal    History Patient Active Problem List   Diagnosis Date Noted   Healthcare maintenance 10/14/2017   Insomnia 10/14/2017   Pre-diabetes 10/14/2017   Screening for colon cancer 10/14/2017   Osteoporosis 03/18/2017   Hyperlipidemia 06/08/2016   Chronic tension-type headache, not intractable 06/08/2016   Past Medical History:  Diagnosis Date   GERD (gastroesophageal reflux disease)    Headache(784.0)     Hyperlipidemia    Medical history non-contributory    Osteoporosis    Past Surgical History:  Procedure Laterality Date   HERNIA REPAIR     HYSTEROSCOPY WITH D & C N/A 07/22/2012   Procedure: DILATATION AND CURETTAGE /HYSTEROSCOPY;  Surgeon: Burnard LABOR. Kandyce, MD;  Location: [facility];  Service: Gynecology;  Laterality: N/A;   LAPAROSCOPY N/A 07/22/2012   Procedure: LAPAROSCOPY OPERATIVE;  Surgeon: Burnard A. Kandyce, MD;  Location: [facility];  Service: Gynecology;  Laterality: N/A;   NO PAST SURGERIES     SALPINGOOPHORECTOMY Bilateral 07/22/2012   Procedure: SALPINGO OOPHORECTOMY;  Surgeon: Burnard A. Kandyce, MD;  Location: [facility];  Service: Gynecology;  Laterality: Bilateral;   Allergies  Allergen Reactions   Penicillins Hives    Childhood reaction   Prior to Admission medications   Medication Sig Start Date End Date Taking? Authorizing Provider  Cholecalciferol (VITAMIN D3) 2000 units TABS Take 1 tablet by mouth daily.   Yes [provider]  omeprazole  (PRILOSEC) 20 MG capsule TAKE 1 CAPSULE BY MOUTH EVERY DAY 03/23/23  Yes Levora Reyes SAUNDERS, MD  rosuvastatin  (CRESTOR ) 10 MG tablet TAKE 1 TABLET BY MOUTH EVERY DAY 09/02/23  Yes Levora Reyes SAUNDERS, MD  famotidine  (PEPCID ) 10 MG tablet Take 1 tablet (10 mg total) by mouth 2 (two) times daily. Patient not taking: Reported on 12/30/2023 06/26/22   Levora Reyes SAUNDERS, MD   Social History   Socioeconomic History   Marital status: Married    Spouse name: Not on file   Number of children: Not on file   Years of education: Not on file   Highest education level: GED or equivalent  Occupational History   Not on file  Tobacco Use   Smoking status: Never   Smokeless tobacco: Never  Vaping Use   Vaping status: Never Used  Substance and Sexual Activity   Alcohol use: Not Currently   Drug use: No   Sexual activity: Not Currently    Birth control/protection: Surgical  Other Topics Concern   Not on file  Social History Narrative   Not  on file   Social Drivers of Health   Financial Resource Strain: Low Risk  (12/26/2023)   Overall Financial Resource Strain (CARDIA)    Difficulty of Paying Living Expenses: Not hard at all  Food Insecurity: No Food Insecurity (12/26/2023)   Hunger Vital Sign    Worried About Running Out of Food in the Last Year: Never true    Ran Out of Food in the Last Year: Never true  Transportation Needs: No Transportation Needs (12/26/2023)   PRAPARE - Transportation    Lack of  Transportation (Medical): No    Lack of Transportation (Non-Medical): No  Physical Activity: Unknown (01/21/2023)   Exercise Vital Sign    Days of Exercise per Week: 2 days    Minutes of Exercise per Session: Patient declined  Stress: No Stress Concern Present (12/26/2023)   [employer] of Occupational Health - Occupational Stress Questionnaire    Feeling of Stress: Not at all  Social Connections: Unknown (12/26/2023)   Social Connection and Isolation Panel    Frequency of Communication with Friends and Family: Once a week    Frequency of Social Gatherings with Friends and Family: Not on file    Attends Religious Services: Not on file    Active Member of Clubs or Organizations: No    Attends Banker Meetings: Not on file    Marital Status: Married  Intimate Partner Violence: Unknown (08/15/2021)   Received from [facility]   HITS    Physically Hurt: Not on file    Insult or Talk Down To: Not on file    Threaten Physical Harm: Not on file    Scream or Curse: Not on file    Review of Systems   Objective:   Vitals:   12/30/23 0812  BP: 110/74  Pulse: 61  Resp: 15  Temp: 97.9 F (36.6 C)  TempSrc: Temporal  SpO2: 97%  Weight: 134 lb 9.6 oz (61.1 kg)  Height: 5' 4.27 (1.632 m)     Physical Exam Vitals reviewed.  Constitutional:      Appearance: She is well-developed.  HENT:     Head: Normocephalic and atraumatic.     Right Ear: External ear normal.     Left Ear: External ear  normal.  Eyes:     Conjunctiva/sclera: Conjunctivae normal.     Pupils: Pupils are equal, round, and reactive to light.  Neck:     Thyroid : No thyromegaly.  Cardiovascular:     Rate and Rhythm: Normal rate and regular rhythm.     Heart sounds: Normal heart sounds. No murmur heard. Pulmonary:     Effort: Pulmonary effort is normal. No respiratory distress.     Breath sounds: Normal breath sounds. No wheezing.  Abdominal:     General: Bowel sounds are normal.     Palpations: Abdomen is soft.     Tenderness: There is no abdominal tenderness.  Musculoskeletal:        General: No tenderness. Normal range of motion.     Cervical back: Normal range of motion and neck supple.  Lymphadenopathy:     Cervical: No cervical adenopathy.  Skin:    General: Skin is warm and dry.     Findings: No rash.  Neurological:     Mental Status: She is alert and oriented to person, place, and time.  Psychiatric:        Behavior: Behavior normal.        Thought Content: Thought content normal.        Assessment & Plan:  ANTONINA DEZIEL is a 68 y.o. female . Annual physical exam  - -anticipatory guidance as below in AVS, screening labs above. Health maintenance items as above in HPI discussed/recommended as applicable.   Pre-diabetes - Plan: Hemoglobin A1c  - Check A1c, discussed exercise.  Continue to watch diet.  Adjust plan based on labs.  Heartburn - Plan: CBC, omeprazole  (PRILOSEC) 20 MG capsule Subacute cough - Plan: omeprazole  (PRILOSEC) 20 MG capsule  - Infrequent dysphagia with hamburgers as above.  Discussed  meeting with gastroenterology but initially deferred.  Recommended daily use of PPI and recheck in 1 month.  Consider GI eval at that time not significantly improved.  RTC precautions if worsening symptoms.  Screening for colon cancer - Plan: Cologuard  Hyperlipidemia, unspecified hyperlipidemia type - Plan: Comprehensive metabolic panel with GFR, Lipid panel, rosuvastatin  (CRESTOR )  10 MG tablet  - Stable, tolerating current regimen. Medications refilled. Labs pending as above.    Meds ordered this encounter  Medications   omeprazole  (PRILOSEC) 20 MG capsule    Sig: Take 1 capsule (20 mg total) by mouth daily.    Dispense:  90 capsule    Refill:  1   rosuvastatin  (CRESTOR ) 10 MG tablet    Sig: Take 1 tablet (10 mg total) by mouth daily.    Dispense:  90 tablet    Refill:  1   Patient Instructions  I would recommend meeting with gastroenterology, but if you want to try taking omeprazole  daily for now, chew foods thoroughly and drinking fluids with meals may help. We will recheck in 1 month.  Return to the clinic or go to the nearest emergency room if any of your symptoms worsen or new symptoms occur.  No other med changes at this time.  Goal of exercise is 150 minutes/week spread out over most days.  Return to walking, biking when able.  Any activities better than that.  Start low and go slow with the intensity of exercise.  If any concerns on labs I will let you know.  Thank you for coming in today.   Health Maintenance After Age 8 After age 58, you are at a higher risk for certain long-term diseases and infections as well as injuries from falls. Falls are a major cause of broken bones and head injuries in people who are older than age 70. Getting regular preventive care can help to keep you healthy and well. Preventive care includes getting regular testing and making lifestyle changes as recommended by your health care provider. Talk with your health care provider about: Which screenings and tests you should have. A screening is a test that checks for a disease when you have no symptoms. A diet and exercise plan that is right for you. What should I know about screenings and tests to prevent falls? Screening and testing are the best ways to find a health problem early. Early diagnosis and treatment give you the best chance of managing medical conditions that are  common after age 37. Certain conditions and lifestyle choices may make you more likely to have a fall. Your health care provider may recommend: Regular vision checks. Poor vision and conditions such as cataracts can make you more likely to have a fall. If you wear glasses, make sure to get your prescription updated if your vision changes. Medicine review. Work with your health care provider to regularly review all of the medicines you are taking, including over-the-counter medicines. Ask your health care provider about any side effects that may make you more likely to have a fall. Tell your health care provider if any medicines that you take make you feel dizzy or sleepy. Strength and balance checks. Your health care provider may recommend certain tests to check your strength and balance while standing, walking, or changing positions. Foot health exam. Foot pain and numbness, as well as not wearing proper footwear, can make you more likely to have a fall. Screenings, including: Osteoporosis screening. Osteoporosis is a condition that causes the bones to  get weaker and break more easily. Blood pressure screening. Blood pressure changes and medicines to control blood pressure can make you feel dizzy. Depression screening. You may be more likely to have a fall if you have a fear of falling, feel depressed, or feel unable to do activities that you used to do. Alcohol use screening. Using too much alcohol can affect your balance and may make you more likely to have a fall. Follow these instructions at home: Lifestyle Do not drink alcohol if: Your health care provider tells you not to drink. If you drink alcohol: Limit how much you have to: 0-1 drink a day for women. 0-2 drinks a day for men. Know how much alcohol is in your drink. In the [place] one drink equals one 12 oz bottle of beer (355 mL), one 5 oz glass of wine (148 mL), or one 1 oz glass of hard liquor (44 mL). Do not use any products that  contain nicotine or tobacco. These products include cigarettes, chewing tobacco, and vaping devices, such as e-cigarettes. If you need help quitting, ask your health care provider. Activity  Follow a regular exercise program to stay fit. This will help you maintain your balance. Ask your health care provider what types of exercise are appropriate for you. If you need a cane or walker, use it as recommended by your health care provider. Wear supportive shoes that have nonskid soles. Safety  Remove any tripping hazards, such as rugs, cords, and clutter. Install safety equipment such as grab bars in bathrooms and safety rails on stairs. Keep rooms and walkways well-lit. General instructions Talk with your health care provider about your risks for falling. Tell your health care provider if: You fall. Be sure to tell your health care provider about all falls, even ones that seem minor. You feel dizzy, tiredness (fatigue), or off-balance. Take over-the-counter and prescription medicines only as told by your health care provider. These include supplements. Eat a healthy diet and maintain a healthy weight. A healthy diet includes low-fat dairy products, low-fat (lean) meats, and fiber from whole grains, beans, and lots of fruits and vegetables. Stay current with your vaccines. Schedule regular health, dental, and eye exams. Summary Having a healthy lifestyle and getting preventive care can help to protect your health and wellness after age 72. Screening and testing are the best way to find a health problem early and help you avoid having a fall. Early diagnosis and treatment give you the best chance for managing medical conditions that are more common for people who are older than age 79. Falls are a major cause of broken bones and head injuries in people who are older than age 11. Take precautions to prevent a fall at home. Work with your health care provider to learn what changes you can make to  improve your health and wellness and to prevent falls. This information is not intended to replace advice given to you by your health care provider. Make sure you discuss any questions you have with your health care provider. Document Revised: 09/16/2020 Document Reviewed: 09/16/2020 Elsevier Patient Education  2024 [employer]      Signed,   Reyes Pines, MD [facility], [facility] 12/30/23 8:49 AM

## 2024-01-04 ENCOUNTER — Ambulatory Visit: Payer: Self-pay | Admitting: Family Medicine

## 2024-01-17 DIAGNOSIS — Z1211 Encounter for screening for malignant neoplasm of colon: Secondary | ICD-10-CM | POA: Diagnosis not present

## 2024-01-22 LAB — COLOGUARD: COLOGUARD: NEGATIVE

## 2024-01-31 ENCOUNTER — Ambulatory Visit: Admitting: Family Medicine

## 2024-06-01 LAB — HM MAMMOGRAPHY

## 2025-01-01 ENCOUNTER — Encounter: Admitting: Family Medicine
